# Patient Record
Sex: Female | Born: 1945
Health system: Southern US, Community
[De-identification: ages and names within clinical notes are randomized; demographics above are authoritative.]

## PROBLEM LIST (undated history)

## (undated) DIAGNOSIS — E7800 Pure hypercholesterolemia, unspecified: Secondary | ICD-10-CM

## (undated) DIAGNOSIS — K219 Gastro-esophageal reflux disease without esophagitis: Secondary | ICD-10-CM

## (undated) HISTORY — DX: Pure hypercholesterolemia, unspecified: E78.00

## (undated) HISTORY — PX: TUBAL LIGATION: SHX77

---

## 1998-09-17 ENCOUNTER — Other Ambulatory Visit: Admission: RE | Admit: 1998-09-17 | Discharge: 1998-09-17 | Payer: Self-pay | Admitting: Obstetrics and Gynecology

## 1999-09-30 ENCOUNTER — Other Ambulatory Visit: Admission: RE | Admit: 1999-09-30 | Discharge: 1999-09-30 | Payer: Self-pay | Admitting: Obstetrics and Gynecology

## 2000-10-23 ENCOUNTER — Other Ambulatory Visit: Admission: RE | Admit: 2000-10-23 | Discharge: 2000-10-23 | Payer: Self-pay | Admitting: Obstetrics and Gynecology

## 2001-11-03 ENCOUNTER — Other Ambulatory Visit: Admission: RE | Admit: 2001-11-03 | Discharge: 2001-11-03 | Payer: Self-pay | Admitting: Obstetrics and Gynecology

## 2003-08-14 ENCOUNTER — Encounter: Admission: RE | Admit: 2003-08-14 | Discharge: 2003-08-14 | Payer: Self-pay | Admitting: Surgery

## 2005-10-28 ENCOUNTER — Encounter: Admission: RE | Admit: 2005-10-28 | Discharge: 2005-10-28 | Payer: Self-pay | Admitting: Surgery

## 2005-11-10 ENCOUNTER — Encounter: Admission: RE | Admit: 2005-11-10 | Discharge: 2005-11-10 | Payer: Self-pay | Admitting: Surgery

## 2006-06-30 ENCOUNTER — Emergency Department (HOSPITAL_COMMUNITY): Admission: EM | Admit: 2006-06-30 | Discharge: 2006-06-30 | Payer: Self-pay | Admitting: Emergency Medicine

## 2006-11-18 ENCOUNTER — Encounter: Admission: RE | Admit: 2006-11-18 | Discharge: 2006-11-18 | Payer: Self-pay | Admitting: Surgery

## 2008-01-12 ENCOUNTER — Encounter: Admission: RE | Admit: 2008-01-12 | Discharge: 2008-01-12 | Payer: Self-pay | Admitting: Obstetrics and Gynecology

## 2009-01-16 ENCOUNTER — Encounter: Admission: RE | Admit: 2009-01-16 | Discharge: 2009-01-16 | Payer: Self-pay | Admitting: Obstetrics and Gynecology

## 2011-01-02 ENCOUNTER — Other Ambulatory Visit: Payer: Self-pay | Admitting: Obstetrics and Gynecology

## 2011-01-02 DIAGNOSIS — Z1231 Encounter for screening mammogram for malignant neoplasm of breast: Secondary | ICD-10-CM

## 2011-01-16 ENCOUNTER — Other Ambulatory Visit: Payer: Self-pay | Admitting: Obstetrics and Gynecology

## 2011-01-16 DIAGNOSIS — Z780 Asymptomatic menopausal state: Secondary | ICD-10-CM

## 2011-01-23 ENCOUNTER — Other Ambulatory Visit: Payer: Self-pay

## 2011-01-29 ENCOUNTER — Other Ambulatory Visit: Payer: Self-pay

## 2011-01-29 ENCOUNTER — Ambulatory Visit: Payer: Self-pay

## 2011-02-06 ENCOUNTER — Ambulatory Visit: Payer: Self-pay

## 2011-02-06 ENCOUNTER — Ambulatory Visit
Admission: RE | Admit: 2011-02-06 | Discharge: 2011-02-06 | Disposition: A | Discharge status: Home / Self Care | Payer: Medicare Other | Source: Ambulatory Visit | Attending: Obstetrics and Gynecology | Admitting: Obstetrics and Gynecology

## 2011-02-06 DIAGNOSIS — Z780 Asymptomatic menopausal state: Secondary | ICD-10-CM

## 2011-02-06 DIAGNOSIS — Z1231 Encounter for screening mammogram for malignant neoplasm of breast: Secondary | ICD-10-CM

## 2012-01-23 ENCOUNTER — Other Ambulatory Visit: Payer: Self-pay | Admitting: Obstetrics and Gynecology

## 2012-01-23 DIAGNOSIS — Z1231 Encounter for screening mammogram for malignant neoplasm of breast: Secondary | ICD-10-CM

## 2012-02-10 ENCOUNTER — Ambulatory Visit: Payer: Medicare Other

## 2012-02-11 ENCOUNTER — Ambulatory Visit: Payer: Medicare Other

## 2012-02-23 ENCOUNTER — Ambulatory Visit
Admission: RE | Admit: 2012-02-23 | Discharge: 2012-02-23 | Disposition: A | Discharge status: Home / Self Care | Payer: Medicare Other | Source: Ambulatory Visit | Attending: Obstetrics and Gynecology | Admitting: Obstetrics and Gynecology

## 2012-02-23 DIAGNOSIS — Z1231 Encounter for screening mammogram for malignant neoplasm of breast: Secondary | ICD-10-CM

## 2013-02-21 ENCOUNTER — Other Ambulatory Visit: Payer: Self-pay | Admitting: Obstetrics and Gynecology

## 2013-02-21 DIAGNOSIS — M8580 Other specified disorders of bone density and structure, unspecified site: Secondary | ICD-10-CM

## 2013-04-19 ENCOUNTER — Other Ambulatory Visit: Payer: Self-pay

## 2013-04-19 DIAGNOSIS — Z1231 Encounter for screening mammogram for malignant neoplasm of breast: Secondary | ICD-10-CM

## 2013-05-16 ENCOUNTER — Ambulatory Visit
Admission: RE | Admit: 2013-05-16 | Discharge: 2013-05-16 | Disposition: A | Discharge status: Home / Self Care | Payer: Medicare Other | Source: Ambulatory Visit | Attending: Obstetrics and Gynecology | Admitting: Obstetrics and Gynecology

## 2013-05-16 ENCOUNTER — Ambulatory Visit
Admission: RE | Admit: 2013-05-16 | Discharge: 2013-05-16 | Disposition: A | Discharge status: Home / Self Care | Payer: Medicare Other | Source: Ambulatory Visit

## 2013-05-16 DIAGNOSIS — M8580 Other specified disorders of bone density and structure, unspecified site: Secondary | ICD-10-CM

## 2013-05-16 DIAGNOSIS — Z1231 Encounter for screening mammogram for malignant neoplasm of breast: Secondary | ICD-10-CM

## 2015-12-28 ENCOUNTER — Encounter (HOSPITAL_COMMUNITY): Payer: Self-pay | Admitting: *Deleted

## 2015-12-28 ENCOUNTER — Emergency Department (HOSPITAL_COMMUNITY): Payer: Medicare Other

## 2015-12-28 ENCOUNTER — Inpatient Hospital Stay (HOSPITAL_COMMUNITY)
Admission: EM | Admit: 2015-12-28 | Discharge: 2015-12-30 | DRG: 378 | Disposition: A | Discharge status: Home / Self Care | Payer: Medicare Other | Attending: Internal Medicine | Admitting: Internal Medicine

## 2015-12-28 ENCOUNTER — Encounter (HOSPITAL_COMMUNITY)
Admission: EM | Disposition: A | Discharge status: Home / Self Care | Payer: Self-pay | Source: Home / Self Care | Attending: Internal Medicine

## 2015-12-28 DIAGNOSIS — Z23 Encounter for immunization: Secondary | ICD-10-CM | POA: Diagnosis not present

## 2015-12-28 DIAGNOSIS — K219 Gastro-esophageal reflux disease without esophagitis: Secondary | ICD-10-CM | POA: Diagnosis present

## 2015-12-28 DIAGNOSIS — E876 Hypokalemia: Secondary | ICD-10-CM | POA: Diagnosis not present

## 2015-12-28 DIAGNOSIS — K254 Chronic or unspecified gastric ulcer with hemorrhage: Principal | ICD-10-CM | POA: Diagnosis present

## 2015-12-28 DIAGNOSIS — K449 Diaphragmatic hernia without obstruction or gangrene: Secondary | ICD-10-CM

## 2015-12-28 DIAGNOSIS — Z7982 Long term (current) use of aspirin: Secondary | ICD-10-CM | POA: Diagnosis not present

## 2015-12-28 DIAGNOSIS — D62 Acute posthemorrhagic anemia: Secondary | ICD-10-CM | POA: Diagnosis present

## 2015-12-28 DIAGNOSIS — K2980 Duodenitis without bleeding: Secondary | ICD-10-CM

## 2015-12-28 DIAGNOSIS — K2970 Gastritis, unspecified, without bleeding: Secondary | ICD-10-CM

## 2015-12-28 DIAGNOSIS — K259 Gastric ulcer, unspecified as acute or chronic, without hemorrhage or perforation: Secondary | ICD-10-CM

## 2015-12-28 DIAGNOSIS — E871 Hypo-osmolality and hyponatremia: Secondary | ICD-10-CM | POA: Diagnosis present

## 2015-12-28 DIAGNOSIS — Z823 Family history of stroke: Secondary | ICD-10-CM | POA: Diagnosis not present

## 2015-12-28 DIAGNOSIS — K250 Acute gastric ulcer with hemorrhage: Secondary | ICD-10-CM | POA: Diagnosis not present

## 2015-12-28 DIAGNOSIS — R112 Nausea with vomiting, unspecified: Secondary | ICD-10-CM | POA: Diagnosis present

## 2015-12-28 DIAGNOSIS — K5900 Constipation, unspecified: Secondary | ICD-10-CM | POA: Diagnosis not present

## 2015-12-28 DIAGNOSIS — R110 Nausea: Secondary | ICD-10-CM | POA: Diagnosis not present

## 2015-12-28 DIAGNOSIS — Z8249 Family history of ischemic heart disease and other diseases of the circulatory system: Secondary | ICD-10-CM | POA: Diagnosis not present

## 2015-12-28 DIAGNOSIS — K920 Hematemesis: Secondary | ICD-10-CM | POA: Diagnosis present

## 2015-12-28 DIAGNOSIS — D649 Anemia, unspecified: Secondary | ICD-10-CM | POA: Diagnosis present

## 2015-12-28 DIAGNOSIS — K922 Gastrointestinal hemorrhage, unspecified: Secondary | ICD-10-CM | POA: Diagnosis present

## 2015-12-28 HISTORY — DX: Gastro-esophageal reflux disease without esophagitis: K21.9

## 2015-12-28 HISTORY — PX: ESOPHAGOGASTRODUODENOSCOPY: SHX5428

## 2015-12-28 LAB — DIFFERENTIAL
Basophils Absolute: 0 10*3/uL (ref 0.0–0.1)
Basophils Relative: 0 %
Eosinophils Absolute: 0 10*3/uL (ref 0.0–0.7)
Eosinophils Relative: 0 %
Lymphocytes Relative: 17 %
Lymphs Abs: 2.7 10*3/uL (ref 0.7–4.0)
Monocytes Absolute: 1.2 10*3/uL — ABNORMAL HIGH (ref 0.1–1.0)
Monocytes Relative: 7 %
Neutro Abs: 11.8 10*3/uL — ABNORMAL HIGH (ref 1.7–7.7)
Neutrophils Relative %: 76 %

## 2015-12-28 LAB — IRON AND TIBC
IRON: 154 ug/dL (ref 28–170)
SATURATION RATIOS: 40 % — AB (ref 10.4–31.8)
TIBC: 384 ug/dL (ref 250–450)
UIBC: 230 ug/dL

## 2015-12-28 LAB — GLUCOSE, CAPILLARY
GLUCOSE-CAPILLARY: 121 mg/dL — AB (ref 65–99)
Glucose-Capillary: 136 mg/dL — ABNORMAL HIGH (ref 65–99)
Glucose-Capillary: 103 mg/dL — ABNORMAL HIGH (ref 65–99)
Glucose-Capillary: 107 mg/dL — ABNORMAL HIGH (ref 65–99)

## 2015-12-28 LAB — CBC
HCT: 21 % — ABNORMAL LOW (ref 36.0–46.0)
HCT: 26.7 % — ABNORMAL LOW (ref 36.0–46.0)
HEMATOCRIT: 26.5 % — AB (ref 36.0–46.0)
Hemoglobin: 7.1 g/dL — ABNORMAL LOW (ref 12.0–15.0)
Hemoglobin: 9 g/dL — ABNORMAL LOW (ref 12.0–15.0)
Hemoglobin: 9.1 g/dL — ABNORMAL LOW (ref 12.0–15.0)
MCH: 29.5 pg (ref 26.0–34.0)
MCH: 29.6 pg (ref 26.0–34.0)
MCH: 30.2 pg (ref 26.0–34.0)
MCHC: 33.8 g/dL (ref 30.0–36.0)
MCHC: 34 g/dL (ref 30.0–36.0)
MCHC: 34.1 g/dL (ref 30.0–36.0)
MCV: 87 fL (ref 78.0–100.0)
MCV: 87.1 fL (ref 78.0–100.0)
MCV: 88.9 fL (ref 78.0–100.0)
PLATELETS: 236 10*3/uL (ref 150–400)
Platelets: 183 10*3/uL (ref 150–400)
Platelets: 326 10*3/uL (ref 150–400)
RBC: 2.41 MIL/uL — ABNORMAL LOW (ref 3.87–5.11)
RBC: 2.98 MIL/uL — ABNORMAL LOW (ref 3.87–5.11)
RBC: 3.07 MIL/uL — ABNORMAL LOW (ref 3.87–5.11)
RDW: 13.1 % (ref 11.5–15.5)
RDW: 13.2 % (ref 11.5–15.5)
RDW: 13.4 % (ref 11.5–15.5)
WBC: 14.2 10*3/uL — ABNORMAL HIGH (ref 4.0–10.5)
WBC: 14.8 10*3/uL — AB (ref 4.0–10.5)
WBC: 15.8 10*3/uL — ABNORMAL HIGH (ref 4.0–10.5)

## 2015-12-28 LAB — COMPREHENSIVE METABOLIC PANEL
ALT: 16 U/L (ref 14–54)
ALT: 14 U/L (ref 14–54)
AST: 19 U/L (ref 15–41)
AST: 14 U/L — AB (ref 15–41)
Albumin: 4 g/dL (ref 3.5–5.0)
Albumin: 3.2 g/dL — ABNORMAL LOW (ref 3.5–5.0)
Alkaline Phosphatase: 47 U/L (ref 38–126)
Alkaline Phosphatase: 37 U/L — ABNORMAL LOW (ref 38–126)
Anion gap: 13 (ref 5–15)
Anion gap: 4 — ABNORMAL LOW (ref 5–15)
BILIRUBIN TOTAL: 1 mg/dL (ref 0.3–1.2)
BUN: 56 mg/dL — ABNORMAL HIGH (ref 6–20)
BUN: 43 mg/dL — AB (ref 6–20)
CALCIUM: 7.6 mg/dL — AB (ref 8.9–10.3)
CO2: 24 mmol/L (ref 22–32)
CO2: 24 mmol/L (ref 22–32)
CREATININE: 0.58 mg/dL (ref 0.44–1.00)
Calcium: 8.5 mg/dL — ABNORMAL LOW (ref 8.9–10.3)
Chloride: 95 mmol/L — ABNORMAL LOW (ref 101–111)
Chloride: 107 mmol/L (ref 101–111)
Creatinine, Ser: 0.94 mg/dL (ref 0.44–1.00)
GFR calc Af Amer: >60 mL/min (ref >60)
GFR calc Af Amer: >60 mL/min (ref >60)
GFR calc non Af Amer: 59 mL/min — ABNORMAL LOW (ref >60)
Glucose, Bld: 232 mg/dL — ABNORMAL HIGH (ref 65–99)
Glucose, Bld: 120 mg/dL — ABNORMAL HIGH (ref 65–99)
Potassium: 3.2 mmol/L — ABNORMAL LOW (ref 3.5–5.1)
Potassium: 3.6 mmol/L (ref 3.5–5.1)
Sodium: 132 mmol/L — ABNORMAL LOW (ref 135–145)
Sodium: 135 mmol/L (ref 135–145)
TOTAL PROTEIN: 5.4 g/dL — AB (ref 6.5–8.1)
Total Bilirubin: 1.1 mg/dL (ref 0.3–1.2)
Total Protein: 6.8 g/dL (ref 6.5–8.1)

## 2015-12-28 LAB — MRSA PCR SCREENING: MRSA by PCR: NEGATIVE

## 2015-12-28 LAB — LIPASE, BLOOD: Lipase: 36 U/L (ref 11–51)

## 2015-12-28 LAB — TROPONIN I: Troponin I: <0.03 ng/mL (ref <0.031)

## 2015-12-28 LAB — SEDIMENTATION RATE: SED RATE: 8 mm/h (ref 0–22)

## 2015-12-28 LAB — PROTIME-INR
INR: 1.16 (ref 0.00–1.49)
PROTHROMBIN TIME: 15 s (ref 11.6–15.2)

## 2015-12-28 LAB — FERRITIN: FERRITIN: 52 ng/mL (ref 11–307)

## 2015-12-28 LAB — VITAMIN B12: VITAMIN B 12: 318 pg/mL (ref 180–914)

## 2015-12-28 LAB — APTT: APTT: 25 s (ref 24–37)

## 2015-12-28 LAB — ABO/RH: ABO/RH(D): O POS

## 2015-12-28 LAB — POC OCCULT BLOOD, ED: Fecal Occult Bld: POSITIVE — AB

## 2015-12-28 LAB — PREPARE RBC (CROSSMATCH)

## 2015-12-28 SURGERY — EGD (ESOPHAGOGASTRODUODENOSCOPY)
Anesthesia: Moderate Sedation

## 2015-12-28 MED ORDER — ONDANSETRON HCL 4 MG/2ML IJ SOLN
4.0000 mg | Freq: Once | INTRAMUSCULAR | Status: AC | PRN
  Administered 2015-12-28: 4 mg via INTRAVENOUS
  Filled 2015-12-28: qty 2

## 2015-12-28 MED ORDER — SODIUM CHLORIDE 0.9 % IV SOLN
80.0000 mg | Freq: Once | INTRAVENOUS | Status: AC
  Administered 2015-12-28: 80 mg via INTRAVENOUS
  Filled 2015-12-28: qty 80

## 2015-12-28 MED ORDER — MIDAZOLAM HCL 5 MG/5ML IJ SOLN
INTRAMUSCULAR | Status: AC
  Administered 2015-12-28: 21:00:00
  Filled 2015-12-28: qty 10

## 2015-12-28 MED ORDER — PNEUMOCOCCAL VAC POLYVALENT 25 MCG/0.5ML IJ INJ
0.5000 mL | INJECTION | INTRAMUSCULAR | Status: AC
  Administered 2015-12-29: 0.5 mL via INTRAMUSCULAR
  Filled 2015-12-28: qty 0.5

## 2015-12-28 MED ORDER — ACETAMINOPHEN 325 MG PO TABS
650.0000 mg | ORAL_TABLET | Freq: Four times a day (QID) | ORAL | Status: DC | PRN
  Administered 2015-12-28: 650 mg via ORAL
  Filled 2015-12-28: qty 2

## 2015-12-28 MED ORDER — SODIUM CHLORIDE 0.9 % IV BOLUS (SEPSIS)
500.0000 mL | Freq: Once | INTRAVENOUS | Status: AC
  Administered 2015-12-28: 500 mL via INTRAVENOUS

## 2015-12-28 MED ORDER — LIDOCAINE VISCOUS 2 % MT SOLN
OROMUCOSAL | Status: AC
  Administered 2015-12-28: 21:00:00
  Filled 2015-12-28: qty 15

## 2015-12-28 MED ORDER — SODIUM CHLORIDE 0.9 % IV SOLN
Freq: Once | INTRAVENOUS | Status: AC
Start: 2015-12-28 — End: 2015-12-28
  Administered 2015-12-28: 09:00:00 via INTRAVENOUS

## 2015-12-28 MED ORDER — STERILE WATER FOR IRRIGATION IR SOLN
Status: DC | PRN
  Administered 2015-12-28: 21:00:00

## 2015-12-28 MED ORDER — SODIUM CHLORIDE 0.9% FLUSH
3.0000 mL | Freq: Two times a day (BID) | INTRAVENOUS | Status: DC
  Administered 2015-12-28 – 2015-12-29 (×5): 3 mL via INTRAVENOUS

## 2015-12-28 MED ORDER — ACETAMINOPHEN 650 MG RE SUPP: 650.0000 mg | Freq: Four times a day (QID) | RECTAL | Status: DC | PRN

## 2015-12-28 MED ORDER — SODIUM CHLORIDE 0.9 % IV SOLN
INTRAVENOUS | Status: DC
  Administered 2015-12-28: 1000 mL via INTRAVENOUS
  Administered 2015-12-28: 04:00:00 via INTRAVENOUS

## 2015-12-28 MED ORDER — PANTOPRAZOLE SODIUM 40 MG IV SOLR
INTRAVENOUS | Status: AC
  Filled 2015-12-28: qty 160

## 2015-12-28 MED ORDER — SODIUM CHLORIDE 0.9 % IV BOLUS (SEPSIS)
1000.0000 mL | Freq: Once | INTRAVENOUS | Status: AC
  Administered 2015-12-28: 1000 mL via INTRAVENOUS

## 2015-12-28 MED ORDER — MIDAZOLAM HCL 5 MG/5ML IJ SOLN
INTRAMUSCULAR | Status: DC | PRN
  Administered 2015-12-28: 2 mg via INTRAVENOUS

## 2015-12-28 MED ORDER — SODIUM CHLORIDE 0.9 % IV SOLN
INTRAVENOUS | Status: DC
  Administered 2015-12-28: 04:00:00 via INTRAVENOUS

## 2015-12-28 MED ORDER — MEPERIDINE HCL 100 MG/ML IJ SOLN
INTRAMUSCULAR | Status: DC | PRN
  Administered 2015-12-28: 25 mg via INTRAVENOUS

## 2015-12-28 MED ORDER — PANTOPRAZOLE SODIUM 40 MG IV SOLR: 40.0000 mg | Freq: Two times a day (BID) | INTRAVENOUS | Status: DC

## 2015-12-28 MED ORDER — INSULIN ASPART 100 UNIT/ML ~~LOC~~ SOLN: 0.0000 [IU] | Freq: Three times a day (TID) | SUBCUTANEOUS | Status: DC

## 2015-12-28 MED ORDER — MEPERIDINE HCL 100 MG/ML IJ SOLN
INTRAMUSCULAR | Status: AC
  Administered 2015-12-28: 21:00:00
  Filled 2015-12-28: qty 2

## 2015-12-28 MED ORDER — SODIUM CHLORIDE 0.9 % IV SOLN
8.0000 mg/h | INTRAVENOUS | Status: DC
  Administered 2015-12-28 (×2): 8 mg/h via INTRAVENOUS
  Filled 2015-12-28 (×9): qty 80

## 2015-12-28 MED ORDER — LIDOCAINE VISCOUS 2 % MT SOLN
OROMUCOSAL | Status: DC | PRN
  Administered 2015-12-28: 6 mL via OROMUCOSAL

## 2015-12-28 MED ORDER — SODIUM CHLORIDE 0.9 % IV SOLN
INTRAVENOUS | Status: AC
  Administered 2015-12-28: 04:00:00 via INTRAVENOUS

## 2015-12-28 MED ORDER — POTASSIUM CHLORIDE 10 MEQ/100ML IV SOLN
10.0000 meq | INTRAVENOUS | Status: AC
  Administered 2015-12-28 (×2): 10 meq via INTRAVENOUS
  Filled 2015-12-28 (×2): qty 100

## 2015-12-28 NOTE — Progress Notes (Signed)
Follow up from admission earlier today:  [id number] female with hx of GERD admitted for UGIB with Hb of 9 g per dL, elevated BUN, with no abdominal pain.  No NSAIDS or alcohol use.  Never had EGD or colonoscopy.   Hb dropped further this am to 7 grams.  Exam is benign and hemodynamically stable. Will give 2 units of PRBC, and await GI consultation.  Will keep NPO and continue with PPI drip.    Houston SirenPeter Avalin Briley, MD  FACP. Hospitalist.

## 2015-12-28 NOTE — Care Management Important Message (Signed)
Important Message  Patient Details  Name: Kimberly Davis MRN: [id number] Date of Birth: 1946/03/18   Medicare Important Message Given:  Yes    Malcolm MetroChildress, Mekai Wilkinson Demske, RN 12/28/2015, 2:25 PM

## 2015-12-28 NOTE — H&P (Signed)
  Primary Care Physician:  Rudi HeapMOORE, DONALD, MD Primary Gastroenterologist:  Dr. Darrick PennaFields  Pre-Procedure History & Physical: HPI:  Kimberly Davis is a 70 y.o. female here for HEMATEMESIS.  Past Medical History  Diagnosis Date  . GERD (gastroesophageal reflux disease)     Past Surgical History  Procedure Laterality Date  . Tubal ligation      Prior to Admission medications   Medication Sig Start Date End Date Taking? Authorizing Provider  Cholecalciferol (VITAMIN D PO) Take 1 tablet by mouth daily.   Yes Historical Provider, MD  Multiple Vitamin (MULTIVITAMIN WITH MINERALS) TABS tablet Take 1 tablet by mouth daily.   Yes Historical Provider, MD  VITAMIN E PO Take 1 tablet by mouth daily.   Yes Historical Provider, MD    Allergies as of 12/28/2015  . (No Known Allergies)    Family History  Problem Relation Age of Onset  . Stroke Mother   . Heart disease Father   . Colon cancer Neg Hx     Social History   Social History  . Marital Status: Married    Spouse Name: N/A  . Number of Children: 3  . Years of Education: N/A   Occupational History  . [employer]    Social History Main Topics  . Smoking status: Never Smoker   . Smokeless tobacco: Not on file  . Alcohol Use: No  . Drug Use: No  . Sexual Activity: Not on file   Other Topics Concern  . Not on file   Social History Narrative    Review of Systems: See HPI, otherwise negative ROS   Physical Exam: BP 107/56 mmHg  Pulse 101  Temp(Src) 98.9 F (37.2 C) (Oral)  Resp 25  Ht 5\' 5"  (1.651 m)  Wt 180 lb 5.4 oz (81.8 kg)  BMI 30.01 kg/m2  SpO2 96% General:   Alert,  pleasant and cooperative in NAD Head:  Normocephalic and atraumatic. Neck:  Supple; Lungs:  Clear throughout to auscultation.    Heart:  Regular rate and rhythm. Abdomen:  Soft, nontender and nondistended. Normal bowel sounds, without guarding, and without rebound.   Neurologic:  Alert and  oriented x4;  grossly normal  neurologically.  Impression/Plan:     HEMATEMESIS  PLAN:  EGD TODAY

## 2015-12-28 NOTE — H&P (Addendum)
TRH H&P   Patient Demographics:    Kimberly Davis, is a 70 y.o. female  MRN: [id number]   DOB - Apr 16, 1946  Admit Date - 12/28/2015  Outpatient Primary MD for the patient is Redge Gainer, MD  Referring MD/NP/PA: Wyona Almas  Outpatient Specialists:   Patient coming from: home  Chief Complaint  Patient presents with  . Nausea      HPI:    Kimberly Davis  is a 70 y.o. female, apparently was at home c/o n/v, hematemesis.  Pt notes that she has had some black stool.  Yesterday.  Denies nsaid use. Pt denies fever, chills, abd pain, diarrhea. Brbpr.   Pt has not had any prior colonscopy or EGD.  No hx of PUD.  Pt was brought to to ED by her daughter  "[place]" and she was noted to be anemic with hgb of 9.1.  Heme positive.  Pt was noted to be hypokalemic.  GI has been paged per ED. Pt will be admitted for w/up of hematemsis.     Review of systems:    In addition to the HPI above,No Fever-chills, No Headache, No changes with Vision or hearing, No problems swallowing food or Liquids, No Chest pain, Cough or Shortness of Breath, No Abdominal pain, Bowel movements are regular, No Blood in stool or Urine, No dysuria, No new skin rashes or bruises, No new joints pains-aches,  No new weakness, tingling, numbness in any extremity, No recent weight gain or loss, No polyuria, polydypsia or polyphagia, No significant Mental Stressors.  A full 10 point Review of Systems was done, except as stated above, all other Review of Systems were negative.   With Past History of the following :    Past Medical History  Diagnosis Date  . GERD (gastroesophageal reflux disease)       Past Surgical History  Procedure Laterality Date  . Tubal ligation        Social History:     Social History  Substance Use Topics  . Smoking status: Never Smoker   . Smokeless tobacco: Not on file  .  Alcohol Use: No     Lives - w husband at home.  Mobility - walks without assistance    Family History :     Family History  Problem Relation Age of Onset  . Stroke Mother   . Heart disease Father       Home Medications:   Prior to Admission medications   Not on File     Allergies:    No Known Allergies   Physical Exam:   Vitals  Blood pressure 119/70, pulse 102, temperature 97.4 F (36.3 C), temperature source Oral, resp. rate 16, height '5\' 5"'  (1.651 m), weight 81.194 kg (179 lb), SpO2 100 %.   1. General elderly white female.  lying in bed in NAD,   2. Normal affect  and insight, Not Suicidal or Homicidal, Awake Alert, Oriented X 3.  3. No F.N deficits, ALL C.Nerves Intact, Strength 5/5 all 4 extremities, Sensation intact all 4 extremities, Plantars down going.  4. Ears and Eyes appear Normal, Conjunctivae clear, PERRLA. Moist Oral Mucosa. Slightly pale conjunctiva  5. Supple Neck, No JVD, No cervical lymphadenopathy appriciated, No Carotid Bruits.  6. Symmetrical Chest wall movement, Good air movement bilaterally, CTAB.  7. RRR, No Gallops, Rubs or Murmurs, No Parasternal Heave.  8. Positive Bowel Sounds, Abdomen Soft, No tenderness, No organomegaly appriciated,No rebound -guarding or rigidity.  9.  No Cyanosis, Normal Skin Turgor, No Skin Rash or Bruise.  10. Good muscle tone,  joints appear normal , no effusions, Normal ROM.  11. No Palpable Lymph Nodes in Neck or Axillae    Data Review:    CBC  Recent Labs Lab 12/28/15 0030  WBC 15.8*  HGB 9.1*  HCT 26.7*  PLT 326  MCV 87.0  MCH 29.6  MCHC 34.1  RDW 13.1  LYMPHSABS 2.7  MONOABS 1.2*  EOSABS 0.0  BASOSABS 0.0   ------------------------------------------------------------------------------------------------------------------  Chemistries   Recent Labs Lab 12/28/15 0030  NA 132*  K 3.2*  CL 95*  CO2 24  GLUCOSE 232*  BUN 56*  CREATININE 0.94  CALCIUM 8.5*  AST 19  ALT 16    ALKPHOS 47  BILITOT 1.1   ------------------------------------------------------------------------------------------------------------------ estimated creatinine clearance is 57 mL/min (by C-G formula based on Cr of 0.94). ------------------------------------------------------------------------------------------------------------------ No results for input(s): TSH, T4TOTAL, T3FREE, THYROIDAB in the last 72 hours.  Invalid input(s): FREET3  Coagulation profile  Recent Labs Lab 12/28/15 0030  INR 1.16   ------------------------------------------------------------------------------------------------------------------- No results for input(s): DDIMER in the last 72 hours. -------------------------------------------------------------------------------------------------------------------  Cardiac Enzymes  Recent Labs Lab 12/28/15 0030  TROPONINI <0.03   ------------------------------------------------------------------------------------------------------------------ No results found for: BNP   ---------------------------------------------------------------------------------------------------------------  Urinalysis No results found for: COLORURINE, APPEARANCEUR, LABSPEC, PHURINE, GLUCOSEU, HGBUR, BILIRUBINUR, KETONESUR, PROTEINUR, UROBILINOGEN, NITRITE, LEUKOCYTESUR  ----------------------------------------------------------------------------------------------------------------   Imaging Results:    Dg Abd Acute W/chest  12/28/2015  CLINICAL DATA:  Nausea vomiting and constipation since yesterday. EXAM: DG ABDOMEN ACUTE W/ 1V CHEST COMPARISON:  None. FINDINGS: The abdominal gas pattern is negative for obstruction or perforation. Stool and air is present throughout the colon. No biliary or urinary calculi are evident. There is a hiatal hernia. No acute cardiopulmonary findings are evident. IMPRESSION: Negative for obstruction or perforation. Hiatal hernia. No acute  cardiopulmonary findings. Electronically Signed   By: Andreas Newport M.D.   On: 12/28/2015 01:59      Assessment & Plan:    Principal Problem:   Hematemesis Active Problems:   Anemia   Nausea & vomiting   Hypokalemia   Hyponatremia    1. Hematemesis Bun elevation c/w UGI bleeding NPO, protonix 11m iv x1, and then 839m/hr GI consult for EGD/colonscopy Appreciate GI in put  2. N/v zofran 45m35mv q6h prn  3. Anemia Check ferritin, iron/tibc, b12, folate, esr Consider spep, upep Serial cbc q6h x4.   4.  Hypokalemia Replete Check cmp in am  5.  Hyponatremia Check cmp in am Hydrate with ns iv.   6. Hyperglycemia Bs>200 Check hga1c,  fsbs q4h ISS (sensitive)   DVT Prophylaxis SCD AM Labs Ordered, also please review Full Orders  Family Communication: Admission, patients condition and plan of care including tests being ordered have been discussed with the patient  who indicate understanding and agree with the plan and Code Status.  Code Status FULL CODE  Likely DC to  home  Condition GUARDED    Consults called: GI per ED   Admission status: inpatient  Time spent in minutes : 40 minutes  Critical care time   Jani Gravel M.D on 12/28/2015 at 2:25 AM   [phone] Between 7am to 7pm - Pager - [phone]. After 7pm go to [email or web address] - password [facility]  [facility] - Office  [phone]

## 2015-12-28 NOTE — ED Provider Notes (Signed)
TIME SEEN:  By signing my name below, I, Kimberly Davis, attest that this documentation has been prepared under the direction and in the presence of Enbridge EnergyKristen N Emme Rosenau, DO. Electronically Signed: Octavia HeirArianna Davis, ED Scribe. 12/28/2015. 12:26 AM.   CHIEF COMPLAINT:  Nausea  HPI:  HPI Comments: Kimberly Davis is a 70 y.o. female who has no pertinent medical history presents to the Emergency Department complaining of sudden onset, intermittent, gradual worsening nausea with associated hematemesis, melena, and constipation onset yesterday evening around 6pm. She says her last normal bowel movement was one week ago. Pt notes taking a stool softener without any relief. Per family member, pt lost consciousness yesterday three times going from sitting to standing. He says she did not hit her head. She denies abdominal pain, chest pain, shortness of breath, fever, chills, hematochezia, heavy NSAID use, etoh use, and endoscopy. Pt is not on any blood thinners. No prior history of GI bleed. Reports her last normal bowel movement was a week ago. She has noticed black stools for the past 1-2 days. Not having gastroenterologist. Her PCP is Dr.  Christell ConstantMoore.  She is a FULL CODE.  ROS: See HPI Constitutional: no fever  Eyes: no drainage  ENT: no runny nose   Cardiovascular:  no chest pain  Resp: no SOB  GI:  vomiting GU: no dysuria Integumentary: no rash  Allergy: no hives  Musculoskeletal: no leg swelling  Neurological: no slurred speech ROS otherwise negative  PAST MEDICAL HISTORY/PAST SURGICAL HISTORY:  No past medical history on file.  MEDICATIONS:  Prior to Admission medications   Not on File    ALLERGIES:  Not on File  SOCIAL HISTORY:  Social History  Substance Use Topics  . Smoking status: Not on file  . Smokeless tobacco: Not on file  . Alcohol Use: Not on file    FAMILY HISTORY: No family history on file.  EXAM: Triage vitals: BP 111/76 mmHg  Pulse 118  Temp(Src) 97.4 F (36.3 C) (Oral)   Resp 20  Ht 5\' 5"  (1.651 m)  Wt 179 lb (81.194 kg)  BMI 29.79 kg/m2  SpO2 97% CONSTITUTIONAL: Alert and oriented and responds appropriately to questions. Appears pale, elderly, uncomfortable HEAD: Normocephalic EYES: Conjunctivae clear, PERRL, pale conjunctiva ENT: normal nose; no rhinorrhea; dry mucous membranes NECK: Supple, no meningismus, no LAD  CARD: Regular and tachycardic; S1 and S2 appreciated; no murmurs, no clicks, no rubs, no gallops RESP: Normal chest excursion without splinting, patient is slightly tachypneic; breath sounds clear and equal bilaterally; no wheezes, no rhonchi, no rales, no hypoxia or respiratory distress, speaking full sentences ABD/GI: Normal bowel sounds; non-distended; soft, non-tender, no rebound, no guarding, no peritoneal signs RECTAL:  Normal rectal tone, patient has no gross blood but does have melena, she is strongly guaiac positive, small external hemorrhoids present without bleeding, no fecal impaction, soft stool in the rectal vault, nontender rectal exam BACK:  The back appears normal and is non-tender to palpation, there is no CVA tenderness EXT: Normal ROM in all joints; non-tender to palpation; no edema; normal capillary refill; no cyanosis, no calf tenderness or swelling    SKIN: Normal color for age and race; warm; no rash NEURO: Moves all extremities equally, sensation to light touch intact diffusely, cranial nerves II through XII intact PSYCH: The patient's mood and manner are appropriate. Grooming and personal hygiene are appropriate.  MEDICAL DECISION MAKING: Patient here with gastrointestinal bleed. She is pale, tachycardic, tachypneic. Hemoglobin is 9.1 with no old for  comparison. She is not sure what her baseline hemoglobin is. We'll give IV fluids, place 2 large-bore peripheral IVs. We'll give Protonix for upper GI bleed. She will need admission. Discussed with patient and family that she may need packed red blood cells and she agrees.  This time she is a full code. Abdominal exam is benign.  ED PROGRESS: 2:10 AM  Patient's BUN is elevated consistent with upper GI bleed. She also has a leukocytosis with left shift which may be reactive. No fever. No further vomiting in the emergency department and no passage of melena. Coags are normal. Heart rate and rest her rate have improved with IV hydration. She looks much better and has better color in her face. I feel she is stable. Do not feel this time she needs to be taken emergently to the endoscopy suite. She is receiving IV Protonix. X-ray shows no obstruction or perforation. Will discuss with hospitalist for admission to step down.  2:20 AM  Discussed patient's case with hospitalist, Dr. Selena Batten.  Recommend admission to step down, inpatient bed.  I will place holding orders per their request. Patient and family (if present) updated with plan. Care transferred to hospitalist service.  I reviewed all nursing notes, vitals, pertinent old records, EKGs, labs, imaging (as available).    EKG Interpretation  Date/Time:  Friday Dec 28 2015 01:04:39 EDT Ventricular Rate:  113 PR Interval:  149 QRS Duration: 92 QT Interval:  325 QTC Calculation: 446 R Axis:   25 Text Interpretation:  Sinus tachycardia Ventricular premature complex Low voltage, precordial leads Abnormal R-wave progression, early transition Nonspecific T abnormalities, diffuse leads No old tracing to compare Confirmed by Staton Markey,  DO, Earland Reish ([id number]) on 12/28/2015 1:08:32 AM        CRITICAL CARE Performed by: Raelyn Number   Total critical care time: 45 minutes  Critical care time was exclusive of separately billable procedures and treating other patients.  Critical care was necessary to treat or prevent imminent or life-threatening deterioration.  Critical care was time spent personally by me on the following activities: development of treatment plan with patient and/or surrogate as well as nursing, discussions with  consultants, evaluation of patient's response to treatment, examination of patient, obtaining history from patient or surrogate, ordering and performing treatments and interventions, ordering and review of laboratory studies, ordering and review of radiographic studies, pulse oximetry and re-evaluation of patient's condition.    I personally performed the services described in this documentation, which was scribed in my presence. The recorded information has been reviewed and is accurate.   Layla Maw Jasim Harari, DO 12/28/15 [phone]

## 2015-12-28 NOTE — Care Management Note (Signed)
Case Management Note  Patient Details  Name: Kimberly Davis MRN: [id number] Date of Birth: 05-12-1946  Subjective/Objective:                  Pt admitted for UGIB. Pt is from home, lives with husband, has PCP, transportation and no difficulty affording medications. Pt plans to return home with self care at DC.   Action/Plan: No CM needs anticipated.   Expected Discharge Date:    12/28/2015              Expected Discharge Plan:  Home/Self Care  In-House Referral:  NA  Discharge planning Services  NA  Post Acute Care Choice:  NA Choice offered to:  NA  DME Arranged:    DME Agency:     HH Arranged:    HH Agency:     Status of Service:  Completed, signed off  Medicare Important Message Given:    yes Date Medicare IM Given:    Medicare IM give by:    Date Additional Medicare IM Given:    Additional Medicare Important Message give by:     If discussed at Long Length of Stay Meetings, dates discussed:    Additional Comments:  Kimberly Davis, Kimberly Mcleish Demske, RN 12/28/2015, 2:22 PM

## 2015-12-28 NOTE — ED Notes (Signed)
Pt started vomiting yesterday and has been constipated; pt's last BM was yesterday and it was small

## 2015-12-28 NOTE — Consult Note (Signed)
Referring Provider: Houston Siren, MD Primary Care Physician:  Rudi Heap, MD Primary Gastroenterologist:  Jonette Eva, MD  Reason for Consultation:  hematemesis  HPI: Kimberly Davis is a 70 y.o. female presented early this AM with complaints of worsening N/V, hematemesis, melena, constipation. Symptoms initially began several days ago. Thought she had GI bug. Going around at work. Frequent vomiting and felt dehydrated. This morning vomiting blood. Had noted dark stools for 1-2 days, this morning black tarry stool. Last normal bowel movement was one week ago. Apparently patient lost consciousness three times yesterday, postural. No abdominal pain, rectal bleeding. No significant heartburn. Takes TUMS prn. No dysphagia. Worked up until yesterday. Takes ASA 81mg  daily but no other NSAIDs. No prior GI bleeding. No EGD/TCS.   In ER, melanotic stool, heme + on DRE. Hgb 9.1. Patient denies history of anemia. Last physical about two years ago. This morning Hgb down to 7.1. Platelets and INR normal.  Feels better at this time. No further nausea.    Prior to Admission medications   Medication Sig Start Date End Date Taking? Authorizing Provider  Cholecalciferol (VITAMIN D PO) Take 1 tablet by mouth daily.   Yes Historical Provider, MD  Multiple Vitamin (MULTIVITAMIN WITH MINERALS) TABS tablet Take 1 tablet by mouth daily.   Yes Historical Provider, MD  VITAMIN E PO Take 1 tablet by mouth daily.   Yes Historical Provider, MD  TUMS prn ASA 81mg  daily  Current Facility-Administered Medications  Medication Dose Route Frequency Provider Last Rate Last Dose  . acetaminophen (TYLENOL) tablet 650 mg  650 mg Oral Q6H PRN Pearson Grippe, MD       Or  . acetaminophen (TYLENOL) suppository 650 mg  650 mg Rectal Q6H PRN Pearson Grippe, MD      . insulin aspart (novoLOG) injection 0-9 Units  0-9 Units Subcutaneous TID WC Pearson Grippe, MD   Stopped at 12/28/15 [id number]  . pantoprazole (PROTONIX) 80 mg in sodium chloride 0.9 % 250  mL (0.32 mg/mL) infusion  8 mg/hr Intravenous Continuous Kristen N Ward, DO 25 mL/hr at 12/28/15 1300 8 mg/hr at 12/28/15 1300  . [START ON 12/29/2015] pneumococcal 23 valent vaccine (PNU-IMMUNE) injection 0.5 mL  0.5 mL Intramuscular Tomorrow-1000 Pearson Grippe, MD      . sodium chloride flush (NS) 0.9 % injection 3 mL  3 mL Intravenous Q12H Pearson Grippe, MD   3 mL at 12/28/15 0847    Allergies as of 12/28/2015  . (No Known Allergies)    Past Medical History  Diagnosis Date  . GERD (gastroesophageal reflux disease)     Past Surgical History  Procedure Laterality Date  . Tubal ligation      Family History  Problem Relation Age of Onset  . Stroke Mother   . Heart disease Father   . Colon cancer Neg Hx     Social History   Social History  . Marital Status: Married    Spouse Name: N/A  . Number of Children: 3  . Years of Education: N/A   Occupational History  . [employer]    Social History Main Topics  . Smoking status: Never Smoker   . Smokeless tobacco: Not on file  . Alcohol Use: No  . Drug Use: No  . Sexual Activity: Not on file   Other Topics Concern  . Not on file   Social History Narrative     ROS:  General: No fever or chills. +fatigue, weakness. Eyes: Negative for vision changes.  ENT:  Negative for hoarseness, difficulty swallowing , nasal congestion. CV: Negative for chest pain, angina, palpitations, dyspnea on exertion, peripheral edema.  Respiratory: Negative for dyspnea at rest, dyspnea on exertion, cough, sputum, wheezing.  GI: See history of present illness. GU:  Negative for dysuria, hematuria, urinary incontinence, urinary frequency, nocturnal urination.  MS: Negative for joint pain, low back pain.  Derm: Negative for rash or itching.  Neuro: Negative for weakness, abnormal sensation, seizure, frequent headaches, memory loss, confusion.  Psych: Negative for anxiety, depression, suicidal ideation, hallucinations.  Endo: Negative for unusual weight  change.  Heme: Negative for bruising or bleeding. Allergy: Negative for rash or hives.       Physical Examination: Vital signs in last 24 hours: Temp:  [97 F (36.1 C)-98.9 F (37.2 C)] 98.7 F (37.1 C) (05/12 1300) Pulse Rate:  [96-118] 102 (05/12 1300) Resp:  [16-22] 22 (05/12 1300) BP: (91-119)/(55-76) 97/55 mmHg (05/12 1300) SpO2:  [97 %-100 %] 98 % (05/12 1300) Weight:  [179 lb (81.194 kg)-180 lb 5.4 oz (81.8 kg)] 180 lb 5.4 oz (81.8 kg) (05/12 0329) Last BM Date: 12/28/15  General: Well-nourished, well-developed in no acute distress. accompanied by daughter.  Head: Normocephalic, atraumatic.   Eyes: Conjunctiva pink, no icterus. Mouth: Oropharyngeal mucosa moist and pink , no lesions erythema or exudate. Neck: Supple without thyromegaly, masses, or lymphadenopathy.  Lungs: Clear to auscultation bilaterally.  Heart: Regular rate and rhythm, no murmurs rubs or gallops.  Abdomen: Bowel sounds are normal, nontender, nondistended, no hepatosplenomegaly or masses, no abdominal bruits or    hernia , no rebound or guarding.   Rectal: not performed Extremities: No lower extremity edema, clubbing, deformity.  Neuro: Alert and oriented x 4 , grossly normal neurologically.  Skin: Warm and dry, no rash or jaundice.   Psych: Alert and cooperative, normal mood and affect.        Intake/Output from previous day: 05/11 0701 - 05/12 0700 In: 2704.2 [I.V.:2704.2] Out: -  Intake/Output this shift: Total I/O In: 770 [I.V.:450; Blood:320] Out: 450 [Urine:450]  Lab Results: CBC  Recent Labs  12/28/15 0030 12/28/15 0705  WBC 15.8* 14.2*  HGB 9.1* 7.1*  HCT 26.7* 21.0*  MCV 87.0 87.1  PLT 326 236   BMET  Recent Labs  12/28/15 0030 12/28/15 0436  NA 132* 135  K 3.2* 3.6  CL 95* 107  CO2 24 24  GLUCOSE 232* 120*  BUN 56* 43*  CREATININE 0.94 0.58  CALCIUM 8.5* 7.6*   LFT  Recent Labs  12/28/15 0030 12/28/15 0436  BILITOT 1.1 1.0  ALKPHOS 47 37*  AST 19 14*   ALT 16 14  PROT 6.8 5.4*  ALBUMIN 4.0 3.2*    Lipase  Recent Labs  12/28/15 0030  LIPASE 36    PT/INR  Recent Labs  12/28/15 0030  LABPROT 15.0  INR 1.16      Imaging Studies: Dg Abd Acute W/chest  12/28/2015  CLINICAL DATA:  Nausea vomiting and constipation since yesterday. EXAM: DG ABDOMEN ACUTE W/ 1V CHEST COMPARISON:  None. FINDINGS: The abdominal gas pattern is negative for obstruction or perforation. Stool and air is present throughout the colon. No biliary or urinary calculi are evident. There is a hiatal hernia. No acute cardiopulmonary findings are evident. IMPRESSION: Negative for obstruction or perforation. Hiatal hernia. No acute cardiopulmonary findings. Electronically Signed   By: Ellery Plunkaniel R Mitchell M.D.   On: 12/28/2015 01:59  [4 week]   Impression: 70 y/o female with recent onset N/V, constipation  over the past several days who presented to hospital with melena, weakness, syncope. Developed hematemesis as well. Hgb down 2 grams/dL from 9 to 7 since this morning. Hemodynamically stable at this time. Receiving two units of prbcs. Suspect M-W tear.   Plan: 1. Plan on EGD, possible later today or tomorrow. Discussed with patient and daughter,  I have discussed the risks, alternatives, benefits with regards to but not limited to the risk of reaction to medication, bleeding, infection, perforation and the patient is agreeable to proceed. Written consent to be obtained. 2. PPI as ordered. 3. Colonoscopy at a later date.   We would like to thank you for the opportunity to participate in the care of Kimberly Davis.  Leanna Battles. Dixon Boos [facility] [phone] 5/12/20172:08 PM     LOS: 0 days

## 2015-12-29 DIAGNOSIS — K254 Chronic or unspecified gastric ulcer with hemorrhage: Secondary | ICD-10-CM | POA: Diagnosis present

## 2015-12-29 DIAGNOSIS — K449 Diaphragmatic hernia without obstruction or gangrene: Secondary | ICD-10-CM

## 2015-12-29 LAB — CBC
HEMATOCRIT: 23.7 % — AB (ref 36.0–46.0)
HEMOGLOBIN: 7.9 g/dL — AB (ref 12.0–15.0)
MCH: 29.8 pg (ref 26.0–34.0)
MCHC: 33.3 g/dL (ref 30.0–36.0)
MCV: 89.4 fL (ref 78.0–100.0)
Platelets: 174 10*3/uL (ref 150–400)
RBC: 2.65 MIL/uL — AB (ref 3.87–5.11)
RDW: 14.4 % (ref 11.5–15.5)
WBC: 8.7 10*3/uL (ref 4.0–10.5)

## 2015-12-29 LAB — TYPE AND SCREEN
ABO/RH(D): O POS
ANTIBODY SCREEN: NEGATIVE
Unit division: 0
Unit division: 0

## 2015-12-29 LAB — CBC WITH DIFFERENTIAL/PLATELET
BASOS ABS: 0 10*3/uL (ref 0.0–0.1)
BASOS PCT: 0 %
EOS PCT: 1 %
Eosinophils Absolute: 0.1 10*3/uL (ref 0.0–0.7)
HEMATOCRIT: 24.3 % — AB (ref 36.0–46.0)
Hemoglobin: 8 g/dL — ABNORMAL LOW (ref 12.0–15.0)
LYMPHS PCT: 16 %
Lymphs Abs: 1.6 10*3/uL (ref 0.7–4.0)
MCH: 29.4 pg (ref 26.0–34.0)
MCHC: 32.9 g/dL (ref 30.0–36.0)
MCV: 89.3 fL (ref 78.0–100.0)
MONO ABS: 0.8 10*3/uL (ref 0.1–1.0)
MONOS PCT: 8 %
NEUTROS ABS: 7.3 10*3/uL (ref 1.7–7.7)
Neutrophils Relative %: 75 %
PLATELETS: 156 10*3/uL (ref 150–400)
RBC: 2.72 MIL/uL — ABNORMAL LOW (ref 3.87–5.11)
RDW: 14.2 % (ref 11.5–15.5)
WBC: 9.9 10*3/uL (ref 4.0–10.5)

## 2015-12-29 LAB — HEMOGLOBIN A1C
Hgb A1c MFr Bld: 5.7 % — ABNORMAL HIGH (ref 4.8–5.6)
MEAN PLASMA GLUCOSE: 117 mg/dL

## 2015-12-29 LAB — HEMOGLOBIN AND HEMATOCRIT, BLOOD
HCT: 23.1 % — ABNORMAL LOW (ref 36.0–46.0)
HEMATOCRIT: 24.4 % — AB (ref 36.0–46.0)
HEMOGLOBIN: 7.7 g/dL — AB (ref 12.0–15.0)
HEMOGLOBIN: 8.1 g/dL — AB (ref 12.0–15.0)

## 2015-12-29 MED ORDER — PANTOPRAZOLE SODIUM 40 MG PO TBEC
40.0000 mg | DELAYED_RELEASE_TABLET | Freq: Two times a day (BID) | ORAL | Status: DC
  Administered 2015-12-29 – 2015-12-30 (×3): 40 mg via ORAL
  Filled 2015-12-29 (×3): qty 1

## 2015-12-29 NOTE — Op Note (Signed)
[facility] Patient Name: Kimberly Davis Procedure Date: 12/28/2015 8:30 PM MRN: [id number] Date of Birth: [birth year] Attending MD: Jonette Eva , MD CSN: [id number] Age: 70 Admit Type: Inpatient Procedure:                Upper GI endoscopy WITH COLD FORCEPS BIOPSY Indications:              Hematemesis, Melena Providers:                Jonette Eva, MD, Jannett Celestine, RN, Judee Clara, RN Referring MD:             Ernestina Penna Medicines:                Meperidine 25 mg IV, Midazolam 2 mg IV Complications:            No immediate complications. Estimated Blood Loss:     Estimated blood loss was minimal. Procedure:                Pre-Anesthesia Assessment:                           - Prior to the procedure, a History and Physical                            was performed, and patient medications and                            allergies were reviewed. The patient's tolerance of                            previous anesthesia was also reviewed. The risks                            and benefits of the procedure and the sedation                            options and risks were discussed with the patient.                            All questions were answered, and informed consent                            was obtained. Prior Anticoagulants: The patient has                            taken aspirin, last dose was 1 day prior to                            procedure. ASA Grade Assessment: II - A patient                            with mild systemic disease. After reviewing the                            risks and benefits, the patient was deemed in  satisfactory condition to undergo the procedure.                           After obtaining informed consent, the endoscope was                            passed under direct vision. Throughout the                            procedure, the patient's blood pressure, pulse, and                            oxygen saturations  were monitored continuously. The                            EG-[id number] ([id number]) scope was introduced through the                            mouth, and advanced to the second part of duodenum.                            After obtaining informed consent, the endoscope was                            passed under direct vision. Throughout the                            procedure, the patient's blood pressure, pulse, and                            oxygen saturations were monitored continuously.The                            upper GI endoscopy was accomplished with ease. The                            patient tolerated the procedure well. Scope In: 9:04:58 PM Scope Out: 9:11:51 PM Total Procedure Duration: 0 hours 6 minutes 53 seconds  Findings:      The examined esophagus was normal.      A large hiatal hernia was present.      One non-bleeding cratered gastric ulcer with no stigmata of bleeding was       found in the gastric body. The lesion was 10 mm in largest dimension.      Diffuse moderate inflammation characterized by congestion (edema) and       erythema was found in the gastric antrum. Biopsies were taken with a       cold forceps for Helicobacter pylori testing.      Scattered mild inflammation characterized by congestion (edema) and       erythema was found in the duodenal bulb.      The second portion of the duodenum was normal. Impression:               - Normal esophagus.                           -  Large hiatal hernia.                           - UGI BLEED DUE TO Non-bleeding gastric ulcer                            WITHIN THE HIATAL HERNIA POUCH(CAMERON'S ULCER)                           - Gastritis, MODERATE                           - Duodenitis, MILD.                           - Normal second portion of the duodenum. Moderate Sedation:      Moderate (conscious) sedation was administered by the endoscopy nurse       and supervised by the endoscopist. The following  parameters were       monitored: oxygen saturation, heart rate, blood pressure, and response       to care. Total physician intraservice time was 11 minutes. Recommendation:           - Return patient to hospital ward for ongoing care.                           - Resume previous diet.                           - Use Protonix (pantoprazole) 40 mg PO BID.                           - Await pathology results.                           - Repeat upper endoscopy in 3 months for                            surveillance.                           - Return to GI office in 2 months.                           - - No aspirin, ibuprofen, naproxen, or other                            non-steroidal anti-inflammatory drugs FOR [2 MOS]                            after biopsy.                           - Patient has a contact number available for                            emergencies. The signs and symptoms of potential  delayed complications were discussed with the                            patient. Return to normal activities tomorrow.                            Written discharge instructions were provided to the                            patient. Procedure Code(s):        --- Professional ---                           [phone], Esophagogastroduodenoscopy, flexible,                            transoral; with biopsy, single or multiple                           G0500, Moderate sedation services provided by the                            same physician or other qualified health care                            professional performing a gastrointestinal                            endoscopic service that sedation supports,                            requiring the presence of an independent trained                            observer to assist in the monitoring of the                            patient's level of consciousness and physiological                            status;  initial 15 minutes of intra-service time;                            patient age 33 years or older (additional time may                            be reported with 54098, as appropriate) Diagnosis Code(s):        --- Professional ---                           K44.9, Diaphragmatic hernia without obstruction or                            gangrene  K25.9, Gastric ulcer, unspecified as acute or                            chronic, without hemorrhage or perforation                           K29.70, Gastritis, unspecified, without bleeding                           K29.80, Duodenitis without bleeding                           K92.0, Hematemesis                           K92.1, Melena (includes Hematochezia) CPT copyright 2016 [employer] Medical Association. All rights reserved. The codes documented in this report are preliminary and upon coder review may  be revised to meet current compliance requirements. Jonette Eva, MD Jonette Eva, MD 12/28/2015 9:36:56 PM This report has been signed electronically. Number of Addenda: 0

## 2015-12-29 NOTE — Progress Notes (Signed)
[facility] PROGRESS NOTE  Kimberly SalinesKay B Hardenbrook WUJ:[id number] DOB: Sep 04, 1945    PCP:   Rudi HeapMOORE, DONALD, MD   HPI: Kimberly Davis is an 70 y.o. female admitted for upper GI bleed requiring 2 units of PRBCs yesterday.  EGD showed non bleeding gastric ulcer, s/p biopsied.  She is able to eat and clinically has no further bleeding.  Her Hb this am was 8 g per dL.  She is currently on PPI BID.   Rewiew of Systems:  Constitutional: Negative for malaise, fever and chills. No significant weight loss or weight gain Eyes: Negative for eye pain, redness and discharge, diplopia, visual changes, or flashes of light. ENMT: Negative for ear pain, hoarseness, nasal congestion, sinus pressure and sore throat. No headaches; tinnitus, drooling, or problem swallowing. Cardiovascular: Negative for chest pain, palpitations, diaphoresis, dyspnea and peripheral edema. ; No orthopnea, PND Respiratory: Negative for cough, hemoptysis, wheezing and stridor. No pleuritic chestpain. Gastrointestinal: Negative for nausea, vomiting, diarrhea, constipation, abdominal pain, melena, blood in stool, hematemesis, jaundice and rectal bleeding.    Genitourinary: Negative for frequency, dysuria, incontinence,flank pain and hematuria; Musculoskeletal: Negative for back pain and neck pain. Negative for swelling and trauma.;  Skin: . Negative for pruritus, rash, abrasions, bruising and skin lesion.; ulcerations Neuro: Negative for headache, lightheadedness and neck stiffness. Negative for weakness, altered level of consciousness , altered mental status, extremity weakness, burning feet, involuntary movement, seizure and syncope.  Psych: negative for anxiety, depression, insomnia, tearfulness, panic attacks, hallucinations, paranoia, suicidal or homicidal ideation    Past Medical History  Diagnosis Date  . GERD (gastroesophageal reflux disease)     Past Surgical History  Procedure Laterality Date  . Tubal ligation       Medications:  HOME MEDS: Prior to Admission medications   Medication Sig Start Date End Date Taking? Authorizing Provider  Cholecalciferol (VITAMIN D PO) Take 1 tablet by mouth daily.   Yes Historical Provider, MD  Multiple Vitamin (MULTIVITAMIN WITH MINERALS) TABS tablet Take 1 tablet by mouth daily.   Yes Historical Provider, MD  VITAMIN E PO Take 1 tablet by mouth daily.   Yes Historical Provider, MD     Allergies:  No Known Allergies  Social History:   reports that she has never smoked. She does not have any smokeless tobacco history on file. She reports that she does not drink alcohol or use illicit drugs.  Family History: Family History  Problem Relation Age of Onset  . Stroke Mother   . Heart disease Father   . Colon cancer Neg Hx      Physical Exam: Filed Vitals:   12/29/15 0645 12/29/15 0700 12/29/15 0800 12/29/15 0815  BP: 86/54 96/57 82/57  93/51  Pulse: 81 89 89 97  Temp:      TempSrc:      Resp: 13 22 21 21   Height:      Weight:      SpO2: 97% 99% 100% 100%   Blood pressure 93/51, pulse 97, temperature 97.5 F (36.4 C), temperature source Oral, resp. rate 21, height 5\' 5"  (1.651 m), weight 85.4 kg (188 lb 4.4 oz), SpO2 100 %.  GEN:  Pleasant  patient lying in the stretcher in no acute distress; cooperative with exam. PSYCH:  alert and oriented x4; does not appear anxious or depressed; affect is appropriate. HEENT: Mucous membranes pink and anicteric; PERRLA; EOM intact; no cervical lymphadenopathy nor thyromegaly or carotid bruit; no JVD; There were no stridor. Neck is very supple. Breasts:: Not  examined CHEST WALL: No tenderness CHEST: Normal respiration, clear to auscultation bilaterally.  HEART: Regular rate and rhythm.  There are no murmur, rub, or gallops.   BACK: No kyphosis or scoliosis; no CVA tenderness ABDOMEN: soft and non-tender; no masses, no organomegaly, normal abdominal bowel sounds; no pannus; no intertriginous candida. There is no  rebound and no distention. Rectal Exam: Not done EXTREMITIES: No bone or joint deformity; age-appropriate arthropathy of the hands and knees; no edema; no ulcerations.  There is no calf tenderness. Genitalia: not examined PULSES: 2+ and symmetric SKIN: Normal hydration no rash or ulceration CNS: Cranial nerves 2-12 grossly intact no focal lateralizing neurologic deficit.  Speech is fluent; uvula elevated with phonation, facial symmetry and tongue midline. DTR are normal bilaterally, cerebella exam is intact, barbinski is negative and strengths are equaled bilaterally.  No sensory loss.   Labs on Admission:  Basic Metabolic Panel:  Recent Labs Lab 12/28/15 0030 12/28/15 0436  NA 132* 135  K 3.2* 3.6  CL 95* 107  CO2 24 24  GLUCOSE 232* 120*  BUN 56* 43*  CREATININE 0.94 0.58  CALCIUM 8.5* 7.6*   Liver Function Tests:  Recent Labs Lab 12/28/15 0030 12/28/15 0436  AST 19 14*  ALT 16 14  ALKPHOS 47 37*  BILITOT 1.1 1.0  PROT 6.8 5.4*  ALBUMIN 4.0 3.2*    Recent Labs Lab 12/28/15 0030  LIPASE 36   CBC:  Recent Labs Lab 12/28/15 0030 12/28/15 0705 12/28/15 1633 12/28/15 2357 12/29/15 0648  WBC 15.8* 14.2* 14.8*  --  9.9  NEUTROABS 11.8*  --   --   --  7.3  HGB 9.1* 7.1* 9.0* 7.7* 8.0*  8.1*  HCT 26.7* 21.0* 26.5* 23.1* 24.3*  24.4*  MCV 87.0 87.1 88.9  --  89.3  PLT 326 236 183  --  156   Cardiac Enzymes:  Recent Labs Lab 12/28/15 0030 12/28/15 0436  TROPONINI <0.03 <0.03    CBG:  Recent Labs Lab 12/28/15 0334 12/28/15 0730 12/28/15 1118 12/28/15 1722  GLUCAP 136* 103* 107* 121*     Radiological Exams on Admission: Dg Abd Acute W/chest  12/28/2015  CLINICAL DATA:  Nausea vomiting and constipation since yesterday. EXAM: DG ABDOMEN ACUTE W/ 1V CHEST COMPARISON:  None. FINDINGS: The abdominal gas pattern is negative for obstruction or perforation. Stool and air is present throughout the colon. No biliary or urinary calculi are evident. There  is a hiatal hernia. No acute cardiopulmonary findings are evident. IMPRESSION: Negative for obstruction or perforation. Hiatal hernia. No acute cardiopulmonary findings. Electronically Signed   By: Ellery Plunk M.D.   On: 12/28/2015 01:59   Assessment/Plan Present on Admission:  . Anemia . Nausea & vomiting . Hematemesis . Hypokalemia . Hyponatremia  PLAN:  UGIB:  From gastric ulcer.  Stable now.  Will transfer to floor.  Follow H and H q 12 hours.  Continue with PPI.  No ASA, NSAIDS.  Plan d/c tomorrow if H and H is stable.   D/C telemetry OK.   HypoK:  Supplemented.   Other plans as per orders. Code Status: FULL Unk Lightning, MD.  FACP [facility] Pager [phone] 7pm to 7am.  12/29/2015, 9:17 AM

## 2015-12-29 NOTE — Progress Notes (Signed)
Patient ID: Kimberly SalinesKay B Ostrow, female   DOB: [birth year], 70 y.o.   MRN: [id number]   Assessment/Plan: ADMITTED WITH HEMATEMESIS/MELENA AND SYNCOPE. EGD MAY 12 SHOWED CLEAN BASED GASTRIC ULCER LIKELY DUE TO ISCHEMIA, ASA USE, AND/OR H PYLORI GASTRITIS. UNABLE TO APPRECIATE A MALLORY WEISS TEAR. ADDITIONAL EROSIONS SEEN IN HIATAL HERNIA POUCH. CLINICALLY IMPROVED.  PLAN: 1. MONITOR Hb 2. BID PPI 3. ANTICIPATE DISCHARGE WITHIN THE NEXT 24 HRS 3. PLAN OUTPATIENT TCS AT TIME OF REPEAT EGD  GREATER THAN 50% WAS SPENT IN COUNSELING & COORDINATION OF CARE WITH THE PATIENT, SON, AND HUSBAND: DISCUSSED DIFFERENTIAL DIAGNOSIS, PROCEDURE, BENEFITS, AND MANAGEMENT OF GSTRIC ULCERS/GI BLEED. TOTAL ENCOUNTER TIME: 25 MINS.    Subjective: Since I last evaluated the patient SHE HAD SMALL AMOUNT OF BLACK STOOL TODAY. NO HEMATEMESIS, CHEST PAIN, OR SOB.  Objective: Vital signs in last 24 hours: Filed Vitals:   12/29/15 0800 12/29/15 0815  BP: 82/57 93/51  Pulse: 89 97  Temp:    Resp: 21 21    General appearance: alert, cooperative and no distress Resp: clear to auscultation bilaterally Cardio: regular rate and rhythm GI: soft, non-tender; bowel sounds normal;   Lab Results:   MAY 12   MAY 12  MAY 12 MAY 13 Hb 9.1  7.1  9.0  8.0-8.1    Studies/Results: No results found.  Medications: I have reviewed the patient's current medications.   LOS: 5 days   Jonette EvaSandi Fields 01/26/2014, 2:23 PM

## 2015-12-30 ENCOUNTER — Telehealth: Payer: Self-pay | Admitting: Gastroenterology

## 2015-12-30 DIAGNOSIS — K250 Acute gastric ulcer with hemorrhage: Secondary | ICD-10-CM

## 2015-12-30 MED ORDER — DOCUSATE SODIUM 100 MG PO CAPS: 100.0000 mg | ORAL_CAPSULE | Freq: Two times a day (BID) | ORAL | Status: DC

## 2015-12-30 MED ORDER — PANTOPRAZOLE SODIUM 40 MG PO TBEC: 40.0000 mg | DELAYED_RELEASE_TABLET | Freq: Two times a day (BID) | ORAL | Status: DC

## 2015-12-30 NOTE — Progress Notes (Signed)
Pt IV removed, tolerated well.  Reviewed discharge instructions and prescriptions with pt.  Answered all questions at this time.

## 2015-12-30 NOTE — Discharge Summary (Signed)
Physician Discharge Summary  Kimberly Davis ZOX:[id number] DOB: [birth year] DOA: 12/28/2015  PCP: Rudi Heap, MD  Admit date: 12/28/2015 Discharge date: 12/30/2015  Time spent: 35 minutes  Recommendations for Outpatient Follow-up:  1. Follow up with Dr Darrick Penna as scheduled.   2. Follow up with PCP next week.    Discharge Diagnoses:  Principal Problem:   Hematemesis Active Problems:   Anemia   Nausea & vomiting   Hypokalemia   Hyponatremia   Upper GI bleed   Acute blood loss anemia   Hiatal hernia   Gastric ulcer with hemorrhage   Discharge Condition: Improved.   BP is better.  No further GI Bleeding.  Hb stable at  8 g/dL.   Diet recommendation: cardiac.   Filed Weights   12/29/15 0400 12/29/15 1700 12/30/15 0620  Weight: 85.4 kg (188 lb 4.4 oz) 85.4 kg (188 lb 4.4 oz) 84.868 kg (187 lb 1.6 oz)    History of present illness: Patient was admitted by Dr Selena Batten into the ICU on Dec 28, 2015 for hematemesis.  As per his H and P:  " Kimberly Davis is a 70 y.o. female, apparently was at home c/o n/v, hematemesis. Pt notes that she has had some black stool. Yesterday. Denies nsaid use. Pt denies fever, chills, abd pain, diarrhea. Brbpr. Pt has not had any prior colonscopy or EGD. No hx of PUD. Pt was brought to to ED by her daughter "[name]" and she was noted to be anemic with hgb of 9.1. Heme positive. Pt was noted to be hypokalemic. GI has been paged per ED. Pt will be admitted for w/up of hematemsis.    Hospital Course: Kimberly Davis is an 70 y.o. female admitted for upper GI bleed requiring 2 units of PRBCs the fallowing day as her Hct dropped.  She was seen in consultation with GI, and Dr Jettie Booze performed an EGD showiug a non bleeding gastric ulcer.  It was biopsied. She was subsequently started on her diet, and tolerated well.  Her BP was soft, and thus she was kept in the hospital and transferred to the floor.  Her PPI drip was converted to oral PPI to be taken BID.   Her Hb  remained stable, though anemic at 8 g per dL.  She feels well and did not require transfusion.  Dr Jettie Booze made arrangement for her to follow up in the office, as she will need to have a follow up endoscopy in about 6-8 weeks.  She will avoid NSAIDS, ASA, but she can take tylenol.  She is anxious to go home, and will be discharged to home today.  She will see her PCP next week, and see GI as arranged by Dr Darrick Penna.  Thank you so much and Good Day.    Procedures:  EGD.   Consultations:  GI:   Dr Darrick Penna.   Discharge Exam: Filed Vitals:   12/29/15 2206 12/30/15 0620  BP: 115/55 111/55  Pulse: 92 97  Temp: 98.5 F (36.9 C) 98.5 F (36.9 C)  Resp: 18 18    Discharge Instructions   Current Discharge Medication List    START taking these medications   Details  docusate sodium (COLACE) 100 MG capsule Take 1 capsule (100 mg total) by mouth 2 (two) times daily. Qty: 10 capsule, Refills: 0    pantoprazole (PROTONIX) 40 MG tablet Take 1 tablet (40 mg total) by mouth 2 (two) times daily before a meal. Qty: 60 tablet, Refills: 1  CONTINUE these medications which have NOT CHANGED   Details  Cholecalciferol (VITAMIN D PO) Take 1 tablet by mouth daily.    Multiple Vitamin (MULTIVITAMIN WITH MINERALS) TABS tablet Take 1 tablet by mouth daily.      STOP taking these medications     VITAMIN E PO        No Known Allergies    The results of significant diagnostics from this hospitalization (including imaging, microbiology, ancillary and laboratory) are listed below for reference.    Significant Diagnostic Studies: Dg Abd Acute W/chest  12/28/2015  CLINICAL DATA:  Nausea vomiting and constipation since yesterday. EXAM: DG ABDOMEN ACUTE W/ 1V CHEST COMPARISON:  None. FINDINGS: The abdominal gas pattern is negative for obstruction or perforation. Stool and air is present throughout the colon. No biliary or urinary calculi are evident. There is a hiatal hernia. No acute  cardiopulmonary findings are evident. IMPRESSION: Negative for obstruction or perforation. Hiatal hernia. No acute cardiopulmonary findings. Electronically Signed   By: Ellery Plunkaniel R Mitchell M.D.   On: 12/28/2015 01:59    Microbiology: Recent Results (from the past 240 hour(s))  MRSA PCR Screening     Status: None   Collection Time: 12/28/15  2:57 AM  Result Value Ref Range Status   MRSA by PCR NEGATIVE NEGATIVE Final    Comment:        The GeneXpert MRSA Assay (FDA approved for NASAL specimens only), is one component of a comprehensive MRSA colonization surveillance program. It is not intended to diagnose MRSA infection nor to guide or monitor treatment for MRSA infections.      Labs: Basic Metabolic Panel:  Recent Labs Lab 12/28/15 0030 12/28/15 0436  NA 132* 135  K 3.2* 3.6  CL 95* 107  CO2 24 24  GLUCOSE 232* 120*  BUN 56* 43*  CREATININE 0.94 0.58  CALCIUM 8.5* 7.6*   Liver Function Tests:  Recent Labs Lab 12/28/15 0030 12/28/15 0436  AST 19 14*  ALT 16 14  ALKPHOS 47 37*  BILITOT 1.1 1.0  PROT 6.8 5.4*  ALBUMIN 4.0 3.2*    Recent Labs Lab 12/28/15 0030  LIPASE 36   CBC:  Recent Labs Lab 12/28/15 0030 12/28/15 0705 12/28/15 1633 12/28/15 2357 12/29/15 0648 12/29/15 2138  WBC 15.8* 14.2* 14.8*  --  9.9 8.7  NEUTROABS 11.8*  --   --   --  7.3  --   HGB 9.1* 7.1* 9.0* 7.7* 8.0*  8.1* 7.9*  HCT 26.7* 21.0* 26.5* 23.1* 24.3*  24.4* 23.7*  MCV 87.0 87.1 88.9  --  89.3 89.4  PLT 326 236 183  --  156 174     Recent Labs Lab 12/28/15 0030 12/28/15 0436  TROPONINI <0.03 <0.03   CBG:  Recent Labs Lab 12/28/15 0334 12/28/15 0730 12/28/15 1118 12/28/15 1722  GLUCAP 136* 103* 107* 121*    Signed:  Kalii Chesmore MD. Jerrel IvoryFACP.  [facility] 12/30/2015, 9:39 AM

## 2015-12-30 NOTE — Telephone Encounter (Signed)
PT D/C MAY 14 PRESENTED WITH HEMATEMESIS, MELENA, AND SYNCOPE. OPV 2 MOS W/ [facility] E30 GI BLEED/PUD. PLEASE CALL PT WHEN APPT IS BOOKED.

## 2015-12-31 ENCOUNTER — Telehealth: Payer: Self-pay | Admitting: Gastroenterology

## 2015-12-31 LAB — FOLATE RBC
FOLATE, RBC: 1865 ng/mL (ref >498)
Folate, Hemolysate: 387.9 ng/mL
HEMATOCRIT: 20.8 % — AB (ref 34.0–46.6)

## 2015-12-31 NOTE — Telephone Encounter (Signed)
PATIENT WAS SEEN IN HOSPITAL AND HAS FU WITH [facility] IN 2 MONTHS    [facility] DID ENDO ON PATIENT.  PATIENT WANTS TO KNOW WHAT KIND OF DIET SHE SHOULD BE EATING SINCE SHE HAS AN ULCER   PLEASE ADVISE [phone]

## 2015-12-31 NOTE — Telephone Encounter (Signed)
ON RECALL  °

## 2015-12-31 NOTE — Telephone Encounter (Signed)
Anna, please advise!

## 2016-01-01 ENCOUNTER — Encounter (HOSPITAL_COMMUNITY): Payer: Self-pay | Admitting: Gastroenterology

## 2016-01-01 NOTE — Telephone Encounter (Signed)
Do we have any handouts on PUD? This would be helpful to send. Needs to avoid NSAIDs, aspirin products. Limit foods that trigger GERD symptoms. Avoid the foods on the gastritis/GERD handout.

## 2016-01-01 NOTE — Telephone Encounter (Signed)
Pt is aware and I am mailing a couple of handouts.

## 2016-01-16 ENCOUNTER — Telehealth: Payer: Self-pay | Admitting: Gastroenterology

## 2016-01-16 MED ORDER — PANTOPRAZOLE SODIUM 40 MG PO TBEC: 40.0000 mg | DELAYED_RELEASE_TABLET | Freq: Two times a day (BID) | ORAL | Status: DC

## 2016-01-16 MED ORDER — AMOXICILLIN 500 MG PO TABS: ORAL_TABLET | ORAL | Status: DC

## 2016-01-16 MED ORDER — CLARITHROMYCIN 500 MG PO TABS: ORAL_TABLET | ORAL | Status: DC

## 2016-01-16 NOTE — Telephone Encounter (Addendum)
PLEASE CALL PT. Her stomach Bx showed H. Pylori infection. THIS BACTERIA CONTRIBUTES TO ULCERS FORMING WHEN YOU ARE USING ASA OR NSAIDS. She needs AMOXICILLIN 500 mg 2 po BID for 10 days and Biaxin 500 mg po bid for 10 days. She needs PROTONIX BID for 3 MOS then 1 po DAILY. Med side effects include NAUSEA, VOMITING, DIARRHEA, ABDOMINAL pain, and metallic taste.   - Repeat upper endoscopy in 3 months for surveillance. - Return to GI office in JUL 2017 E30 PUD/UGI BLEED/H PYLORI GASTRITIS. - - No aspirin, ibuprofen, naproxen, or other non-steroidal anti-inflammatory drugs FOR UNTIL AUG 2017.

## 2016-01-17 ENCOUNTER — Encounter: Payer: Self-pay | Admitting: Gastroenterology

## 2016-01-17 NOTE — Telephone Encounter (Signed)
PT is aware.

## 2016-01-17 NOTE — Telephone Encounter (Signed)
appt made and letter sent, on recall

## 2016-03-06 ENCOUNTER — Ambulatory Visit: Payer: Medicare Other | Admitting: Gastroenterology

## 2016-04-02 ENCOUNTER — Other Ambulatory Visit: Payer: Self-pay

## 2016-04-02 ENCOUNTER — Ambulatory Visit (INDEPENDENT_AMBULATORY_CARE_PROVIDER_SITE_OTHER): Payer: Medicare Other | Admitting: Gastroenterology

## 2016-04-02 ENCOUNTER — Encounter: Payer: Self-pay | Admitting: Gastroenterology

## 2016-04-02 DIAGNOSIS — K219 Gastro-esophageal reflux disease without esophagitis: Secondary | ICD-10-CM | POA: Diagnosis not present

## 2016-04-02 DIAGNOSIS — K254 Chronic or unspecified gastric ulcer with hemorrhage: Secondary | ICD-10-CM

## 2016-04-02 DIAGNOSIS — K279 Peptic ulcer, site unspecified, unspecified as acute or chronic, without hemorrhage or perforation: Secondary | ICD-10-CM

## 2016-04-02 NOTE — Assessment & Plan Note (Signed)
SYMPTOMS FAIRLY WELL CONTROLLED UNLESS SHE EATS SPICY FOOD.  CONTINUE PROTONIX. TAKE 30 MINUTES PRIOR TO BREAKFAST AND AS NEEDED.

## 2016-04-02 NOTE — Progress Notes (Signed)
cc'ed to pcp °

## 2016-04-02 NOTE — Progress Notes (Signed)
   Subjective:    Patient ID: Kimberly Davis, female    DOB: 05/02/1946, 70 y.o.   MRN: [id number]  MOORE, DONALD, MD   HPI No questions or concerns. BMs:1-2X/DAY-NL. TOOK ABX WITH PROTONIX AND NOW COMPLETED ABX. NO SIDE EFFECTS. BEEN TAKING PROTONIX BID SINCE MAY 2017. OCCASIONAL HEARTBURN: AFTER EATING SOMETHING SPICY(~2X/MO). MAY HAVE CRAMPING IN HER FEET. NO CONSTIPATION SINCE BEING ON STOOL SOFTENER BID.  PT DENIES FEVER, CHILLS, HEMATOCHEZIA, HEMATEMESIS, nausea, vomiting, melena, diarrhea, CHEST PAIN, SHORTNESS OF BREATH,  CHANGE IN BOWEL IN HABITS, constipation, abdominal pain, problems swallowing, OR problems with sedation.   Past Medical History:  Diagnosis Date  . GERD (gastroesophageal reflux disease)     Past Surgical History:  Procedure Laterality Date  . ESOPHAGOGASTRODUODENOSCOPY N/A 12/28/2015   Procedure: ESOPHAGOGASTRODUODENOSCOPY (EGD);  Surgeon: Garrie Woodin L Dossie Ocanas, MD;  Location: [facility];  Service: Endoscopy;  Laterality: N/A;  . TUBAL LIGATION      No Known Allergies  Current Outpatient Prescriptions  Medication Sig Dispense Refill  . Calcium 600-200 MG-UNIT tablet Take 1 tablet by mouth 2 (two) times daily.    . Cholecalciferol (VITAMIN D PO) Take 1 tablet by mouth daily.    . docusate sodium (COLACE) 100 MG capsule Take 1 capsule (100 mg total) by mouth 2 (two) times daily. 10 capsule 0  . Magnesium 250 MG TABS Take 250 mg by mouth daily.    . Multiple Vitamin (MULTIVITAMIN WITH MINERALS) TABS tablet Take 1 tablet by mouth daily.    . pantoprazole (PROTONIX) 40 MG tablet Take 1 tablet (40 mg total) by mouth 2 (two) times daily before a meal. 60 tablet 11   No current facility-administered medications for this visit.     Review of Systems PER HPI OTHERWISE ALL SYSTEMS ARE NEGATIVE.     Objective:   Physical Exam  Constitutional: She is oriented to person, place, and time. She appears well-developed and well-nourished. No distress.  HENT:  Head:  Normocephalic and atraumatic.  Mouth/Throat: Oropharynx is clear and moist. No oropharyngeal exudate.  Eyes: Pupils are equal, round, and reactive to light. No scleral icterus.  Neck: Normal range of motion. Neck supple.  Cardiovascular: Normal rate, regular rhythm and normal heart sounds.   Pulmonary/Chest: Effort normal and breath sounds normal. No respiratory distress.  Abdominal: Soft. Bowel sounds are normal. She exhibits no distension. There is no tenderness.  Musculoskeletal: She exhibits no edema.  Lymphadenopathy:    She has no cervical adenopathy.  Neurological: She is alert and oriented to person, place, and time.  Psychiatric: She has a normal mood and affect.  Vitals reviewed.         Assessment & Plan:   

## 2016-04-02 NOTE — Patient Instructions (Addendum)
UPPER ENDOSCOPY FRI AUG 18 FOR SURVEILLANCE AND TO ASSESS HEALING. BIOPSIES FOR H PYLORI.   NO ASPIRIN, BC/GOODY POWDERS, IBUPROFEN/MOTRIN, OR NAPROXEN/ALEVE UNTIL AFTER UPPER ENDOSCOPY.  CONTINUE PROTONIX. TAKE 30 MINUTES PRIOR TO BREAKFAST AND TAKE A SECOND DOSE IF NEEDED.  YOUR NEXT OUTPATIENT VISIT WILL BE SCHEDULED IF NEEDED AFTER ENDOSCOPY.

## 2016-04-02 NOTE — Assessment & Plan Note (Addendum)
In SETTING OF H PYLORI GASTRITIS. COMPLETED ABX. NO SIDE EFFECTS. NO BRBPR OR MELENA.  EGD FRI AUG 18 FOR SURVEILLANCE AND TO ASSESS HEALING. BIOPSIES FOR H PYLORI. DISCUSSED PROCEDURE, BENEFITS, & RISKS: < 1% chance of medication reaction, PERFORATION, OR bleeding. CBC IN POSTOP PROTONIX ONCE DAILY HOLD ASA//NSAIDS UNTIL AFTER UPPER ENDOSCOPY. OPV TBS SCHEDULED AFTER ENDOSCOPY.

## 2016-04-04 ENCOUNTER — Encounter (HOSPITAL_COMMUNITY)
Admission: RE | Disposition: A | Discharge status: Home / Self Care | Payer: Self-pay | Source: Ambulatory Visit | Attending: Gastroenterology

## 2016-04-04 ENCOUNTER — Encounter (HOSPITAL_COMMUNITY): Payer: Self-pay | Admitting: *Deleted

## 2016-04-04 ENCOUNTER — Ambulatory Visit (HOSPITAL_COMMUNITY)
Admission: RE | Admit: 2016-04-04 | Discharge: 2016-04-04 | Disposition: A | Discharge status: Home / Self Care | Payer: Medicare Other | Source: Ambulatory Visit | Attending: Gastroenterology | Admitting: Gastroenterology

## 2016-04-04 DIAGNOSIS — K449 Diaphragmatic hernia without obstruction or gangrene: Secondary | ICD-10-CM | POA: Diagnosis not present

## 2016-04-04 DIAGNOSIS — K219 Gastro-esophageal reflux disease without esophagitis: Secondary | ICD-10-CM | POA: Diagnosis not present

## 2016-04-04 DIAGNOSIS — K2950 Unspecified chronic gastritis without bleeding: Secondary | ICD-10-CM | POA: Insufficient documentation

## 2016-04-04 DIAGNOSIS — K279 Peptic ulcer, site unspecified, unspecified as acute or chronic, without hemorrhage or perforation: Secondary | ICD-10-CM

## 2016-04-04 DIAGNOSIS — K2970 Gastritis, unspecified, without bleeding: Secondary | ICD-10-CM

## 2016-04-04 DIAGNOSIS — Z8711 Personal history of peptic ulcer disease: Secondary | ICD-10-CM | POA: Diagnosis not present

## 2016-04-04 DIAGNOSIS — Z79899 Other long term (current) drug therapy: Secondary | ICD-10-CM | POA: Diagnosis not present

## 2016-04-04 DIAGNOSIS — Z09 Encounter for follow-up examination after completed treatment for conditions other than malignant neoplasm: Secondary | ICD-10-CM | POA: Insufficient documentation

## 2016-04-04 DIAGNOSIS — B9681 Helicobacter pylori [H. pylori] as the cause of diseases classified elsewhere: Secondary | ICD-10-CM | POA: Diagnosis not present

## 2016-04-04 HISTORY — PX: ESOPHAGOGASTRODUODENOSCOPY: SHX5428

## 2016-04-04 SURGERY — EGD (ESOPHAGOGASTRODUODENOSCOPY)
Anesthesia: Moderate Sedation

## 2016-04-04 MED ORDER — MEPERIDINE HCL 100 MG/ML IJ SOLN
INTRAMUSCULAR | Status: DC | PRN
  Administered 2016-04-04: 25 mg via INTRAVENOUS

## 2016-04-04 MED ORDER — MIDAZOLAM HCL 5 MG/5ML IJ SOLN
INTRAMUSCULAR | Status: DC | PRN
  Administered 2016-04-04 (×4): 1 mg via INTRAVENOUS

## 2016-04-04 MED ORDER — MIDAZOLAM HCL 5 MG/5ML IJ SOLN
INTRAMUSCULAR | Status: AC
  Filled 2016-04-04: qty 5

## 2016-04-04 MED ORDER — LIDOCAINE VISCOUS 2 % MT SOLN
OROMUCOSAL | Status: AC
  Filled 2016-04-04: qty 15

## 2016-04-04 MED ORDER — SODIUM CHLORIDE 0.9 % IV SOLN
INTRAVENOUS | Status: DC
Start: 2016-04-04 — End: 2016-04-04
  Administered 2016-04-04: 1000 mL via INTRAVENOUS

## 2016-04-04 MED ORDER — MEPERIDINE HCL 100 MG/ML IJ SOLN
INTRAMUSCULAR | Status: AC
  Filled 2016-04-04: qty 1

## 2016-04-04 MED ORDER — LIDOCAINE VISCOUS 2 % MT SOLN
OROMUCOSAL | Status: DC | PRN
  Administered 2016-04-04: 1 via OROMUCOSAL

## 2016-04-04 NOTE — Discharge Instructions (Addendum)
YOUR ULCERS ARE GONE. You have mild gastritis. I biopsied your stomach to make sure the H PYLORI INFECTION IS RESOLVED.    CONTINUE YOUR WEIGHT LOSS EFFORTS. LOSE TEN POUNDS  AVOID TRIGGERS FOR REFLUX. SEE INFO BELOW.  FOLLOW A LOW FAT DIET.  SEE INFO BELOW.  CONTINUE PROTONIX. TAKE 30 MINUTES PRIOR TO MEALS ONCE OR TWICE DAILY.  YOUR BIOPSY RESULTS WILL BE AVAILABLE IN MY CHART AFTER AUG 22 AND MY OFFICE WILL CONTACT YOU IN 10-14 DAYS WITH YOUR RESULTS.   YOU MAY RESUME ASPIRIN 81 MG DAILY.  WHEN I CALL YOU WITH YOUR RESULTS, WE WILL SCHEDULE YOUR FOLLOW UP APPOINTMENT.   UPPER ENDOSCOPY AFTER CARE Read the instructions outlined below and refer to this sheet in the next week. These discharge instructions provide you with general information on caring for yourself after you leave the hospital. While your treatment has been planned according to the most current medical practices available, unavoidable complications occasionally occur. If you have any problems or questions after discharge, call DR. Tandi Hanko, [phone].  ACTIVITY  You may resume your regular activity, but move at a slower pace for the next 24 hours.   Take frequent rest periods for the next 24 hours.   Walking will help get rid of the air and reduce the bloated feeling in your belly (abdomen).   No driving for 24 hours (because of the medicine (anesthesia) used during the test).   You may shower.   Do not sign any important legal documents or operate any machinery for 24 hours (because of the anesthesia used during the test).    NUTRITION  Drink plenty of fluids.   You may resume your normal diet as instructed by your doctor.   Begin with a light meal and progress to your normal diet. Heavy or fried foods are harder to digest and may make you feel sick to your stomach (nauseated).   Avoid alcoholic beverages for 24 hours or as instructed.    MEDICATIONS  You may resume your normal medications.   WHAT  YOU CAN EXPECT TODAY  Some feelings of bloating in the abdomen.   Passage of more gas than usual.    IF YOU HAD A BIOPSY TAKEN DURING THE UPPER ENDOSCOPY:  Eat a soft diet IF YOU HAVE NAUSEA, BLOATING, ABDOMINAL PAIN, OR VOMITING.    FINDING OUT THE RESULTS OF YOUR TEST Not all test results are available during your visit. DR. Darrick PennaFIELDS WILL CALL YOU WITHIN 14 DAYS OF YOUR PROCEDUE WITH YOUR RESULTS. Do not assume everything is normal if you have not heard from DR. Mica Ramdass, CALL HER OFFICE AT [phone].  SEEK IMMEDIATE MEDICAL ATTENTION AND CALL THE OFFICE: [phone] IF:  You have more than a spotting of blood in your stool.   Your belly is swollen (abdominal distention).   You are nauseated or vomiting.   You have a temperature over 101F.   You have abdominal pain or discomfort that is severe or gets worse throughout the day.      Lifestyle and home remedies TO CONTROL REFLUX SYMPTOMS You may eliminate or reduce the frequency of heartburn by making the following lifestyle changes:   Control your weight. Being overweight is a major risk factor for heartburn and GERD. Excess pounds put pressure on your abdomen, pushing up your stomach and causing acid to back up into your esophagus.    Eat smaller meals. 4 TO 6 MEALS A DAY. This reduces pressure on the lower esophageal sphincter,  helping to prevent the valve from opening and acid from washing back into your esophagus.    Loosen your belt. Clothes that fit tightly around your waist put pressure on your abdomen and the lower esophageal sphincter.     Eliminate heartburn triggers. Everyone has specific triggers.Common triggers such as fatty or fried foods, spicy food, tomato sauce, carbonated beverages, alcohol, chocolate, mint, garlic, onion, caffeine and nicotine may make heartburn worse.    Avoid stooping or bending. Tying your shoes is OK. Bending over for longer periods to weed your garden isn't, especially soon  after eating.    Don't lie down OR BEND OVER after a meal. Wait at least three to four hours after eating before going to bed, and don't lie down right after eating.   Alternative medicine  Several home remedies exist for treating GERD, but they provide only temporary relief. They include drinking baking soda (sodium bicarbonate) added to water or drinking other fluids such as baking soda mixed with cream of tartar and water.  Although these liquids create temporary relief by neutralizing, washing away or buffering acids, eventually they aggravate the situation by adding gas and fluid to your stomach, increasing pressure and causing more acid reflux. Further, adding more sodium to your diet may increase your blood pressure and add stress to your heart, and excessive bicarbonate ingestion can alter the acid-base balance in your body.   Low-Fat Diet  BREADS, CEREALS, PASTA, RICE, DRIED PEAS, AND BEANS These products are high in carbohydrates and most are low in fat. Therefore, they can be increased in the diet as substitutes for fatty foods. They too, however, contain calories and should not be eaten in excess. Cereals can be eaten for snacks as well as for breakfast.  Include foods that contain fiber (fruits, vegetables, whole grains, and legumes). Research shows that fiber may lower blood cholesterol levels, especially the water-soluble fiber found in fruits, vegetables, oat products, and legumes.  FRUITS AND VEGETABLES It is good to eat fruits and vegetables. Besides being sources of fiber, both are rich in vitamins and some minerals. They help you get the daily allowances of these nutrients. Fruits and vegetables can be used for snacks and desserts.  MEATS Limit lean meat, chicken, [place], and fish to no more than 6 ounces per day.  Beef, Pork, and Lamb Use lean cuts of beef, pork, and lamb. Lean cuts include:  Extra-lean ground beef.  Arm roast.  Sirloin tip.  Center-cut ham.  Round  steak.  Loin chops.  Rump roast.  Tenderloin.  Trim all fat off the outside of meats before cooking. It is not necessary to severely decrease the intake of red meat, but lean choices should be made. Lean meat is rich in protein and contains a highly absorbable form of iron. Premenopausal women, in particular, should avoid reducing lean red meat because this could increase the risk for low red blood cells (iron-deficiency anemia).  Chicken and [place] These are good sources of protein. The fat of poultry can be reduced by removing the skin and underlying fat layers before cooking. Chicken and [place] can be substituted for lean red meat in the diet. Poultry should not be fried or covered with high-fat sauces.  Fish and Shellfish Fish is a good source of protein. Shellfish contain cholesterol, but they usually are low in saturated fatty acids. The preparation of fish is important. Like chicken and [place], they should not be fried or covered with high-fat sauces.  EGGS Egg whites  contain no fat or cholesterol. They can be eaten often. Try 1 to 2 egg whites instead of whole eggs in recipes or use egg substitutes that do not contain yolk.  MILK AND DAIRY PRODUCTS Use skim or 1% milk instead of 2% or whole milk. Decrease whole milk, natural, and processed cheeses. Use nonfat or low-fat (2%) cottage cheese or low-fat cheeses made from vegetable oils. Choose nonfat or low-fat (1 to 2%) yogurt. Experiment with evaporated skim milk in recipes that call for heavy cream. Substitute low-fat yogurt or low-fat cottage cheese for sour cream in dips and salad dressings. Have at least 2 servings of low-fat dairy products, such as 2 glasses of skim (or 1%) milk each day to help get your daily calcium intake.  FATS AND OILS Reduce the total intake of fats, especially saturated fat. Butterfat, lard, and beef fats are high in saturated fat and cholesterol. These should be avoided as much as possible. Vegetable fats do  not contain cholesterol, but certain vegetable fats, such as coconut oil, palm oil, and palm kernel oil are very high in saturated fats. These should be limited. These fats are often used in bakery goods, processed foods, popcorn, oils, and nondairy creamers. Vegetable shortenings and some peanut butters contain hydrogenated oils, which are also saturated fats. Read the labels on these foods and check for saturated vegetable oils.  Unsaturated vegetable oils and fats do not raise blood cholesterol. However, they should be limited because they are fats and are high in calories. Total fat should still be limited to 30% of your daily caloric intake. Desirable liquid vegetable oils are corn oil, cottonseed oil, olive oil, canola oil, safflower oil, soybean oil, and sunflower oil. Peanut oil is not as good, but small amounts are acceptable. Buy a heart-healthy tub margarine that has no partially hydrogenated oils in the ingredients. Mayonnaise and salad dressings often are made from unsaturated fats, but they should also be limited because of their high calorie and fat content. Seeds, nuts, peanut butter, olives, and avocados are high in fat, but the fat is mainly the unsaturated type. These foods should be limited mainly to avoid excess calories and fat.  OTHER EATING TIPS Snacks  Most sweets should be limited as snacks. They tend to be rich in calories and fats, and their caloric content outweighs their nutritional value. Some good choices in snacks are graham crackers, melba toast, soda crackers, bagels (no egg), English muffins, fruits, and vegetables. These snacks are preferable to snack crackers, [place] fries, and chips. Popcorn should be air-popped or cooked in small amounts of liquid vegetable oil.  Desserts Eat fruit, low-fat yogurt, and fruit ices instead of pastries, cake, and cookies. Sherbet, angel food cake, gelatin dessert, frozen low-fat yogurt, or other frozen products that do not contain  saturated fat (pure fruit juice bars, frozen ice pops) are also acceptable.   COOKING METHODS Choose those methods that use little or no fat. They include: Poaching.  Braising.  Steaming.  Grilling.  Baking.  Stir-frying.  Broiling.  Microwaving.  Foods can be cooked in a nonstick pan without added fat, or use a nonfat cooking spray in regular cookware. Limit fried foods and avoid frying in saturated fat. Add moisture to lean meats by using water, broth, cooking wines, and other nonfat or low-fat sauces along with the cooking methods mentioned above. Soups and stews should be chilled after cooking. The fat that forms on top after a few hours in the refrigerator should be skimmed  off. When preparing meals, avoid using excess salt. Salt can contribute to raising blood pressure in some people.  EATING AWAY FROM HOME Order entres, potatoes, and vegetables without sauces or butter. When meat exceeds the size of a deck of cards (3 to 4 ounces), the rest can be taken home for another meal. Choose vegetable or fruit salads and ask for low-calorie salad dressings to be served on the side. Use dressings sparingly. Limit high-fat toppings, such as bacon, crumbled eggs, cheese, sunflower seeds, and olives. Ask for heart-healthy tub margarine instead of butter.

## 2016-04-04 NOTE — H&P (View-Only) (Signed)
   Subjective:    Patient ID: Kimberly SalinesKay B Kulkarni, female    DOB: 1945/08/24, 70 y.o.   MRN: [id number]  Rudi HeapMOORE, DONALD, MD   HPI No questions or concerns. BMs:1-2X/DAY-NL. TOOK ABX WITH PROTONIX AND NOW COMPLETED ABX. NO SIDE EFFECTS. BEEN TAKING PROTONIX BID SINCE MAY 2017. OCCASIONAL HEARTBURN: AFTER EATING SOMETHING SPICY(~2X/MO). MAY HAVE CRAMPING IN HER FEET. NO CONSTIPATION SINCE BEING ON STOOL SOFTENER BID.  PT DENIES FEVER, CHILLS, HEMATOCHEZIA, HEMATEMESIS, nausea, vomiting, melena, diarrhea, CHEST PAIN, SHORTNESS OF BREATH,  CHANGE IN BOWEL IN HABITS, constipation, abdominal pain, problems swallowing, OR problems with sedation.   Past Medical History:  Diagnosis Date  . GERD (gastroesophageal reflux disease)     Past Surgical History:  Procedure Laterality Date  . ESOPHAGOGASTRODUODENOSCOPY N/A 12/28/2015   Procedure: ESOPHAGOGASTRODUODENOSCOPY (EGD);  Surgeon: West BaliSandi L Fields, MD;  [facility];  Service: Endoscopy;  Laterality: N/A;  . TUBAL LIGATION      No Known Allergies  Current Outpatient Prescriptions  Medication Sig Dispense Refill  . Calcium 600-200 MG-UNIT tablet Take 1 tablet by mouth 2 (two) times daily.    . Cholecalciferol (VITAMIN D PO) Take 1 tablet by mouth daily.    Marland Kitchen. docusate sodium (COLACE) 100 MG capsule Take 1 capsule (100 mg total) by mouth 2 (two) times daily. 10 capsule 0  . Magnesium 250 MG TABS Take 250 mg by mouth daily.    . Multiple Vitamin (MULTIVITAMIN WITH MINERALS) TABS tablet Take 1 tablet by mouth daily.    . pantoprazole (PROTONIX) 40 MG tablet Take 1 tablet (40 mg total) by mouth 2 (two) times daily before a meal. 60 tablet 11   No current facility-administered medications for this visit.     Review of Systems PER HPI OTHERWISE ALL SYSTEMS ARE NEGATIVE.     Objective:   Physical Exam  Constitutional: She is oriented to person, place, and time. She appears well-developed and well-nourished. No distress.  HENT:  Head:  Normocephalic and atraumatic.  Mouth/Throat: Oropharynx is clear and moist. No oropharyngeal exudate.  Eyes: Pupils are equal, round, and reactive to light. No scleral icterus.  Neck: Normal range of motion. Neck supple.  Cardiovascular: Normal rate, regular rhythm and normal heart sounds.   Pulmonary/Chest: Effort normal and breath sounds normal. No respiratory distress.  Abdominal: Soft. Bowel sounds are normal. She exhibits no distension. There is no tenderness.  Musculoskeletal: She exhibits no edema.  Lymphadenopathy:    She has no cervical adenopathy.  Neurological: She is alert and oriented to person, place, and time.  Psychiatric: She has a normal mood and affect.  Vitals reviewed.         Assessment & Plan:

## 2016-04-04 NOTE — Op Note (Signed)
[facility] Patient Name: Kimberly Davis Procedure Date: 04/04/2016 3:34 PM MRN: [id number] Date of Birth: May 02, 1946 Attending MD: Kimberly Davis , MD CSN: [id number] Age: [id number] Admit Type: Outpatient Procedure:                Upper GI endoscopy WITH COLD FORCEPS BIOPSY Indications:              Follow-up of Helicobacter pylori, Follow-up of                            peptic ulcer Providers:                Kimberly EvaSandi Adalyna Godbee, MD, Jannett CelestineAnitra Bell, RN Referring MD:             Ernestina Pennaonald W. Moore Medicines:                Meperidine 25 mg IV, Midazolam 4 mg IV Complications:            No immediate complications. Estimated Blood Loss:     Estimated blood loss was minimal. Procedure:                Pre-Anesthesia Assessment:                           - Prior to the procedure, a History and Physical                            was performed, and patient medications and                            allergies were reviewed. The patient's tolerance of                            previous anesthesia was also reviewed. The risks                            and benefits of the procedure and the sedation                            options and risks were discussed with the patient.                            All questions were answered, and informed consent                            was obtained. Prior Anticoagulants: The patient has                            taken no previous anticoagulant or antiplatelet                            agents. ASA Grade Assessment: II - A patient with                            mild systemic disease. After reviewing the risks  and benefits, the patient was deemed in                            satisfactory condition to undergo the procedure.                            After obtaining informed consent, the endoscope was                            passed under direct vision. Throughout the                            procedure, the patient's blood pressure, pulse,  and                            oxygen saturations were monitored continuously. The                            [id number] ([id number]) scope was introduced through the                            mouth, and advanced to the second part of duodenum.                            The upper GI endoscopy was accomplished without                            difficulty. The patient tolerated the procedure                            well. Scope In: 3:59:19 PM Scope Out: 4:05:24 PM Total Procedure Duration: 0 hours 6 minutes 5 seconds  Findings:      The examined esophagus was normal.      A medium-sized hiatal hernia was present.      Scattered mild inflammation characterized by congestion (edema) and       erythema was found in the entire examined stomach. Biopsies were taken       with a cold forceps for Helicobacter pylori testing.      The examined duodenum was normal. Impression:               - Medium-sized hiatal hernia.                           - MILD Gastritis.                           - PUD RESOLVED Moderate Sedation:      Moderate (conscious) sedation was administered by the endoscopy nurse       and supervised by the endoscopist. The following parameters were       monitored: oxygen saturation, heart rate, blood pressure, and response       to care. Total physician intraservice time was 17 minutes. Recommendation:           - Low fat diet.                           -  Continue present medications.                           - Await pathology results.                           - Return to my office at appointment to be                            scheduled.                           - Patient has a contact number available for                            emergencies. The signs and symptoms of potential                            delayed complications were discussed with the                            patient. Return to normal activities tomorrow.                            Written discharge  instructions were provided to the                            patient. Procedure Code(s):        --- Professional ---                           [phone], Esophagogastroduodenoscopy, flexible,                            transoral; with biopsy, single or multiple                           99152, Moderate sedation services provided by the                            same physician or other qualified health care                            professional performing the diagnostic or                            therapeutic service that the sedation supports,                            requiring the presence of an independent trained                            observer to assist in the monitoring of the                            patient's level of consciousness and  physiological                            status; initial 15 minutes of intraservice time,                            patient age 38 years or older Diagnosis Code(s):        --- Professional ---                           K44.9, Diaphragmatic hernia without obstruction or                            gangrene                           K29.70, Gastritis, unspecified, without bleeding                           B96.81, Helicobacter pylori [H. pylori] as the                            cause of diseases classified elsewhere                           K27.9, Peptic ulcer, site unspecified, unspecified                            as acute or chronic, without hemorrhage or                            perforation CPT copyright 2016 American Medical Association. All rights reserved. The codes documented in this report are preliminary and upon coder review may  be revised to meet current compliance requirements. Kimberly EvaSandi Kaytie Ratcliffe, MD Kimberly EvaSandi Adaleen Hulgan, MD 04/04/2016 4:25:55 PM This report has been signed electronically. Number of Addenda: 0

## 2016-04-04 NOTE — Interval H&P Note (Signed)
History and Physical Interval Note:  04/04/2016 3:45 PM  Kimberly SalinesKay B Hackler  has presented today for surgery, with the diagnosis of PUD/H.PYLORI  The various methods of treatment have been discussed with the patient and family. After consideration of risks, benefits and other options for treatment, the patient has consented to  Procedure(s) with comments: ESOPHAGOGASTRODUODENOSCOPY (EGD) (N/A) - 300 as a surgical intervention .  The patient's history has been reviewed, patient examined, no change in status, stable for surgery.  I have reviewed the patient's chart and labs.  Questions were answered to the patient's satisfaction.     Eaton CorporationSandi Amarius Toto

## 2016-04-09 ENCOUNTER — Encounter (HOSPITAL_COMMUNITY): Payer: Self-pay | Admitting: Gastroenterology

## 2016-04-10 ENCOUNTER — Telehealth: Payer: Self-pay | Admitting: Gastroenterology

## 2016-04-10 NOTE — Telephone Encounter (Signed)
Reminder in epic to follow up in 6 months. Does patient need to be triaged for first colonoscopy or does she need an OV first. SF is wanting her to have colonoscopy within next 3 months. Please advise.

## 2016-04-10 NOTE — Telephone Encounter (Signed)
PLEASE CALL PT. HER H PYLORI INFECTION IS RESOLVED.   CONTINUE YOUR WEIGHT LOSS EFFORTS. LOSE TEN POUNDS  AVOID TRIGGERS FOR REFLUX.   FOLLOW A LOW FAT DIET.    CONTINUE PROTONIX. TAKE 30 MINUTES PRIOR TO MEALS ONCE OR TWICE DAILY.  YOU MAY RESUME ASPIRIN 81 MG DAILY.  IF SHE HAS NEVER HAD A COLONOSCOPY SHE SHOULD COMPLETE ONE WITHIN THE NEXT 3 MOS.  FOLLOW UP IN 6 MOS.

## 2016-04-11 NOTE — Telephone Encounter (Signed)
LM for pt to call

## 2016-04-17 ENCOUNTER — Telehealth: Payer: Self-pay

## 2016-04-17 NOTE — Telephone Encounter (Signed)
Letter mailed to pt to call.  

## 2016-04-17 NOTE — Telephone Encounter (Signed)
PT called and is aware of her results and will call to schedule first ever colonoscopy when she is ready.

## 2016-06-23 ENCOUNTER — Encounter: Payer: Self-pay | Admitting: Gastroenterology

## 2016-08-13 ENCOUNTER — Encounter: Payer: Self-pay | Admitting: Gastroenterology

## 2016-08-22 ENCOUNTER — Ambulatory Visit: Payer: Self-pay | Admitting: Pediatrics

## 2016-09-02 ENCOUNTER — Telehealth: Payer: Self-pay | Admitting: Gastroenterology

## 2016-09-02 DIAGNOSIS — K529 Noninfective gastroenteritis and colitis, unspecified: Secondary | ICD-10-CM | POA: Diagnosis not present

## 2016-09-02 NOTE — Telephone Encounter (Signed)
PT is aware.

## 2016-09-02 NOTE — Telephone Encounter (Signed)
Pt's daughter, Selena BattenKim, called to say that her mother has been treated for ulcers and is now having vomiting and diarrhea. She doesn't know if its a stomach bug or something else going on and wanted to know what SF recommended. Please call patient at [phone]

## 2016-09-02 NOTE — Telephone Encounter (Signed)
I called and spoke with Kimberly Davis and she said she had some nausea yesterday, vomited a little x 1, but had dry heaves. She had 4-5 episodes of diarrhea but it was only a small amount at the time. None today. She still feels sick to her stomach and has eaten 2 Ritz crackers and drank some water. She was chilled earlier this morning but not now. She does not have a thermometer, but has felt a little warm. Please advise!  She said she was also supposed to schedule a follow up in February with Dr. Darrick PennaFields. I scheduled her for 09/25/2016 at 10:00 AM.

## 2016-09-02 NOTE — Telephone Encounter (Addendum)
PLEASE CALL PT. IF SHE IS FEELING ILL, SHE SHOULD BE TESTED FOR THE FLU AT HER PCPs OFFICE. DRINK WATER TO KEEP YOUR URINE LIGHT YELLOW. SHE CAN LET US KNOW IF SHE NEEDS SOMETHING FOR NAUSEA OR VOMITING. SHE CAN TAKE REG STRENGTH TYLENOL 2 PO TID FOR 3 DAYS TO PREVENT FEVER ACHES/PAINS.

## 2016-09-05 ENCOUNTER — Encounter: Payer: Self-pay | Admitting: Physician Assistant

## 2016-09-05 ENCOUNTER — Ambulatory Visit (INDEPENDENT_AMBULATORY_CARE_PROVIDER_SITE_OTHER): Payer: PPO | Admitting: Physician Assistant

## 2016-09-05 VITALS — BP 86/64 | HR 86 | Temp 98.0°F | Ht 64.5 in | Wt 177.6 lb

## 2016-09-05 DIAGNOSIS — R110 Nausea: Secondary | ICD-10-CM | POA: Diagnosis not present

## 2016-09-05 DIAGNOSIS — K529 Noninfective gastroenteritis and colitis, unspecified: Secondary | ICD-10-CM | POA: Diagnosis not present

## 2016-09-05 DIAGNOSIS — Z8719 Personal history of other diseases of the digestive system: Secondary | ICD-10-CM

## 2016-09-05 DIAGNOSIS — R1110 Vomiting, unspecified: Secondary | ICD-10-CM | POA: Diagnosis not present

## 2016-09-05 MED ORDER — PROMETHAZINE HCL 25 MG PO TABS: 25.0000 mg | ORAL_TABLET | Freq: Four times a day (QID) | ORAL | 0 refills | Status: DC | PRN

## 2016-09-05 NOTE — Patient Instructions (Signed)

## 2016-09-06 ENCOUNTER — Telehealth: Payer: Self-pay | Admitting: Physician Assistant

## 2016-09-06 DIAGNOSIS — R112 Nausea with vomiting, unspecified: Secondary | ICD-10-CM

## 2016-09-06 DIAGNOSIS — R1010 Upper abdominal pain, unspecified: Secondary | ICD-10-CM

## 2016-09-06 DIAGNOSIS — R634 Abnormal weight loss: Secondary | ICD-10-CM

## 2016-09-06 MED ORDER — ONDANSETRON 4 MG PO TBDP: 4.0000 mg | ORAL_TABLET | Freq: Three times a day (TID) | ORAL | 0 refills | Status: DC | PRN

## 2016-09-06 MED ORDER — OMEPRAZOLE 20 MG PO CPDR: 20.0000 mg | DELAYED_RELEASE_CAPSULE | Freq: Every day | ORAL | 3 refills | Status: DC

## 2016-09-07 DIAGNOSIS — R1110 Vomiting, unspecified: Secondary | ICD-10-CM | POA: Insufficient documentation

## 2016-09-07 DIAGNOSIS — Z8719 Personal history of other diseases of the digestive system: Secondary | ICD-10-CM | POA: Insufficient documentation

## 2016-09-07 DIAGNOSIS — R110 Nausea: Secondary | ICD-10-CM | POA: Insufficient documentation

## 2016-09-07 NOTE — Progress Notes (Signed)
BP (!) 86/64   Pulse 86   Temp 98 F (36.7 C) (Oral)   Ht 5' 4.5" (1.638 m)   Wt 177 lb 9.6 oz (80.6 kg)   BMI 30.01 kg/m    Subjective:    Patient ID: Kimberly Davis, female    DOB: 03/14/1946, 71 y.o.   MRN: [id number]  Kimberly Davis is a 71 y.o. female presenting on 09/05/2016 for Nausea (Patient went to urgent care Tuesday with Stomach Virus. Patient is just not feeling better. Vomiting stopped yesterday and Diarrhea stopped Tuesday. ); No appetite; Emesis (Patient had vomititng and Diarrhea Monday and Tuesday. Went to urgent care told she had a virus); Diarrhea; and New Patient (Initial Visit)   Emesis   This is a chronic problem. The current episode started in the past 7 days. The problem has been unchanged. The emesis has an appearance of bile. There has been no fever. Associated symptoms include abdominal pain, diarrhea and dizziness. Pertinent negatives include no chest pain, fever or headaches. She has tried increased fluids and diet change for the symptoms. The treatment provided no relief.  Diarrhea   Associated symptoms include abdominal pain and vomiting. Pertinent negatives include no fever or headaches.    Past Medical History:  Diagnosis Date  . GERD (gastroesophageal reflux disease)    Relevant past medical, surgical, family and social history reviewed and updated as indicated. Interim medical history since our last visit reviewed. Allergies and medications reviewed and updated.   Data reviewed from any sources in EPIC.  Review of Systems  Constitutional: Positive for appetite change, fatigue and unexpected weight change. Negative for fever.  HENT: Negative.   Eyes: Negative.   Respiratory: Negative.   Cardiovascular: Negative.  Negative for chest pain, palpitations and leg swelling.  Gastrointestinal: Positive for abdominal distention, abdominal pain, diarrhea, nausea and vomiting.  Genitourinary: Negative.   Skin: Negative.   Neurological: Positive for  dizziness. Negative for headaches.     Social History   Social History  . Marital status: Married    Spouse name: N/A  . Number of children: 3  . Years of education: N/A   Occupational History  . [employer]    Social History Main Topics  . Smoking status: Never Smoker  . Smokeless tobacco: Never Used  . Alcohol use No  . Drug use: No  . Sexual activity: Not on file   Other Topics Concern  . Not on file   Social History Narrative  . No narrative on file    Past Surgical History:  Procedure Laterality Date  . ESOPHAGOGASTRODUODENOSCOPY N/A 12/28/2015   Procedure: ESOPHAGOGASTRODUODENOSCOPY (EGD);  Surgeon: West BaliSandi L Fields, MD;  [facility];  Service: Endoscopy;  Laterality: N/A;  . ESOPHAGOGASTRODUODENOSCOPY N/A 04/04/2016   Procedure: ESOPHAGOGASTRODUODENOSCOPY (EGD);  Surgeon: West BaliSandi L Fields, MD;  [facility]: [facility];  Service: Endoscopy;  Laterality: N/A;  300  . TUBAL LIGATION      Family History  Problem Relation Age of Onset  . Stroke Mother   . Heart disease Father   . Colon cancer Neg Hx     Allergies as of 09/05/2016   No Known Allergies     Medication List       Accurate as of 09/05/16 11:59 PM. Always use your most recent med list.          Calcium 600-200 MG-UNIT tablet Take 1 tablet by mouth 2 (two) times daily.   docusate sodium 100 MG capsule  Commonly known as:  COLACE Take 1 capsule (100 mg total) by mouth 2 (two) times daily.   Magnesium 250 MG Tabs Take 250 mg by mouth daily.   multivitamin with minerals Tabs tablet Take 1 tablet by mouth daily.   promethazine 25 MG tablet Commonly known as:  PHENERGAN Take 1 tablet (25 mg total) by mouth every 6 (six) hours as needed for nausea or vomiting.          Objective:    BP (!) 86/64   Pulse 86   Temp 98 F (36.7 C) (Oral)   Ht 5' 4.5" (1.638 m)   Wt 177 lb 9.6 oz (80.6 kg)   BMI 30.01 kg/m   No Known Allergies Wt Readings from Last 3 Encounters:  09/05/16  177 lb 9.6 oz (80.6 kg)  04/04/16 181 lb (82.1 kg)  04/02/16 181 lb 6.4 oz (82.3 kg)    Physical Exam  Constitutional: She is oriented to person, place, and time. She appears well-developed and well-nourished.  HENT:  Head: Normocephalic and atraumatic.  Eyes: Conjunctivae and EOM are normal. Pupils are equal, round, and reactive to light.  Cardiovascular: Normal rate, regular rhythm, normal heart sounds and intact distal pulses.   Pulmonary/Chest: Effort normal and breath sounds normal.  Abdominal: Soft. She exhibits no distension and no mass. Bowel sounds are increased. There is tenderness in the right upper quadrant, epigastric area and left upper quadrant. There is no rebound and no guarding.  Neurological: She is alert and oriented to person, place, and time. She has normal reflexes.  Skin: Skin is warm and dry. No rash noted.  Psychiatric: She has a normal mood and affect. Her behavior is normal. Judgment and thought content normal.        Assessment & Plan:   1. Gastroenteritis BRAT diet, insurancrance did not pay for zofran, tried phenergan. Called back in one day and still nausea vomiting. In another phone call Zofran was called back in. She will stop the promethazine.  2. Chronic nausea Referral to gastroenterology Dr. Darrick Penna - promethazine (PHENERGAN) 25 MG tablet; Take 1 tablet (25 mg total) by mouth every 6 (six) hours as needed for nausea or vomiting.  Dispense: 30 tablet; Refill: 0  3. Chronic vomiting Referral to Dr. Darrick Penna gastroenterology - promethazine (PHENERGAN) 25 MG tablet; Take 1 tablet (25 mg total) by mouth every 6 (six) hours as needed for nausea or vomiting.  Dispense: 30 tablet; Refill: 0  4. History of GI bleed Plan to restart omeprazole 20 mg. Medication was called in one day later in a telephone encounter.    Continue all other maintenance medications as listed above. Educational handout given for diarrhea  Follow up plan: Return if symptoms  worsen or fail to improve.  Remus Loffler PA-C [facility] [address]  [place], [place] [address] [phone]   09/07/2016, 10:14 PM

## 2016-09-07 NOTE — Telephone Encounter (Signed)
Prescription sent to pharmacy.

## 2016-09-08 NOTE — Telephone Encounter (Signed)
noted 

## 2016-09-08 NOTE — Telephone Encounter (Signed)
Today - Monday - pt back at work and feeling better   Kimberly Davis - FYI - she does have an appt with Dr Evelina DunField's the first week of FEB

## 2016-09-11 ENCOUNTER — Telehealth: Payer: Self-pay

## 2016-09-11 NOTE — Telephone Encounter (Signed)
noted 

## 2016-09-12 ENCOUNTER — Ambulatory Visit (HOSPITAL_COMMUNITY): Payer: PPO

## 2016-09-25 ENCOUNTER — Encounter: Payer: Self-pay | Admitting: Gastroenterology

## 2016-09-25 ENCOUNTER — Ambulatory Visit (INDEPENDENT_AMBULATORY_CARE_PROVIDER_SITE_OTHER): Payer: PPO | Admitting: Gastroenterology

## 2016-09-25 VITALS — BP 123/79 | HR 73 | Temp 97.6°F | Ht 64.5 in | Wt 181.6 lb

## 2016-09-25 DIAGNOSIS — Z1211 Encounter for screening for malignant neoplasm of colon: Secondary | ICD-10-CM | POA: Diagnosis not present

## 2016-09-25 DIAGNOSIS — K254 Chronic or unspecified gastric ulcer with hemorrhage: Secondary | ICD-10-CM | POA: Diagnosis not present

## 2016-09-25 DIAGNOSIS — K219 Gastro-esophageal reflux disease without esophagitis: Secondary | ICD-10-CM | POA: Diagnosis not present

## 2016-09-25 LAB — CBC WITH DIFFERENTIAL/PLATELET
BASOS ABS: 67 {cells}/uL (ref 0–200)
BASOS PCT: 1 %
Eosinophils Absolute: 134 cells/uL (ref 15–500)
Eosinophils Relative: 2 %
HEMATOCRIT: 40.2 % (ref 35.0–45.0)
Hemoglobin: 13.2 g/dL (ref 11.7–15.5)
LYMPHS PCT: 24 %
Lymphs Abs: 1608 cells/uL (ref 850–3900)
MCH: 28.4 pg (ref 27.0–33.0)
MCHC: 32.8 g/dL (ref 32.0–36.0)
MCV: 86.6 fL (ref 80.0–100.0)
MONO ABS: 603 {cells}/uL (ref 200–950)
MONOS PCT: 9 %
MPV: 9.2 fL (ref 7.5–12.5)
NEUTROS ABS: 4288 {cells}/uL (ref 1500–7800)
Neutrophils Relative %: 64 %
PLATELETS: 315 10*3/uL (ref 140–400)
RBC: 4.64 MIL/uL (ref 3.80–5.10)
RDW: 14.2 % (ref 11.0–15.0)
WBC: 6.7 10*3/uL (ref 3.8–10.8)

## 2016-09-25 NOTE — Assessment & Plan Note (Signed)
SYMPTOMS FAIRLY WELL CONTROLLED WITH DIET AND PRN PROTONIX.  CONTINUE TO MONITOR SYMPTOMS. PROTONIX IF NEEDED

## 2016-09-25 NOTE — Patient Instructions (Addendum)
DRINK WATER TO KEEP YOUR URINE LIGHT YELLOW.  FOLLOW A HIGH FIBER DIET. AVOID ITEMS THAT CAUSE BLOATING & GAS.   I WILL CHECK YOUR BLOOD COUNT TO MAKE SURE IT HAS RETURNED TO NORMAL.  YOU SHOULD CONSIDER A COLONOSCOPY. WHEN YOU ARE READY PLEASE CALL TO SCHEDULE AND WE WILL TRIAGE YOU OVER THE PHONE.  FOLLOW UP IN 6 MOS.    Polyps, Colon  A polyp is extra tissue that grows inside your body. Colon polyps grow in the large intestine. The large intestine, also called the colon, is part of your digestive system. It is a long, hollow tube at the end of your digestive tract where your body makes and stores stool Most polyps are not dangerous. They are benign. This means they are not cancerous. But over time, some types of polyps can turn into cancer. Polyps that are smaller than a pea are usually not harmful. But larger polyps could someday become or may already be cancerous. To be safe, doctors remove all polyps and test them.   WHO GETS POLYPS? Anyone can get polyps, but certain people are more likely than others. You may have a greater chance of getting polyps if:  You are over 50.   You have had polyps before.   Someone in your family has had polyps.   Someone in your family has had cancer of the large intestine.   Find out if someone in your family has had polyps. You may also be more likely to get polyps if you:   Eat a lot of fatty foods   Smoke   Drink alcohol   Do not exercise  Eat too much    PREVENTION There is not one sure way to prevent polyps. You might be able to lower your risk of getting them if you:  Eat more fruits and vegetables and less fatty food.   Do not smoke.   Avoid alcohol.   Exercise every day.   Lose weight if you are overweight.   Eating more calcium and folate can also lower your risk of getting polyps. Some foods that are rich in calcium are milk, cheese, and broccoli. Some foods that are rich in folate are chickpeas, kidney beans, and spinach.

## 2016-09-25 NOTE — Assessment & Plan Note (Signed)
NO WARNING SIGNS/SYMPTOMS. LAST Hb MAY 2017 7.9. NO TCS EVER.  CHECK CBC.

## 2016-09-25 NOTE — Progress Notes (Signed)
   Subjective:    Patient ID: Kimberly Davis, female    DOB: October 11, 1945, 71 y.o.   MRN: [id number]  Remus LofflerAngel S Jones, PA-C   HPI Had virus couple weeks ago  and had vomiting and diarrhea. Felt back to normal since 1 week ago last SAT. IF SHE WATCHES WHAT SHE EATS SHE DOESN'T HAVE REFLUX. NEVER HAD A COLONOSCOPY.   PT DENIES FEVER, CHILLS, HEMATOCHEZIA, HEMATEMESIS, nausea, melena, diarrhea, CHEST PAIN, SHORTNESS OF BREATH,  CHANGE IN BOWEL IN HABITS, constipation, abdominal pain, problems swallowing, OR heartburn or indigestion.  Past Medical History:  Diagnosis Date  . GERD (gastroesophageal reflux disease)     Past Surgical History:  Procedure Laterality Date  . ESOPHAGOGASTRODUODENOSCOPY N/A 12/28/2015   Procedure: ESOPHAGOGASTRODUODENOSCOPY (EGD);  Surgeon: West BaliSandi L Garrett Mitchum, MD;  [facility];  Service: Endoscopy;  Laterality: N/A;  . ESOPHAGOGASTRODUODENOSCOPY N/A 04/04/2016   Procedure: ESOPHAGOGASTRODUODENOSCOPY (EGD);  Surgeon: West BaliSandi L Luman Holway, MD;  [facility];  Service: Endoscopy;  Laterality: N/A;  300  . TUBAL LIGATION      No Known Allergies  Current Outpatient Prescriptions  Medication Sig Dispense Refill  . Calcium 600-200 MG-UNIT tablet Take 1 tablet by mouth 2 (two) times daily.    Marland Kitchen. docusate sodium 100 MG capsule Take 1 capsule (100 mg total) by mouth 2 (two) times daily.    . Magnesium 250 MG TABS Take 250 mg by mouth daily.    . Multiple Vitamin (MULTIVITAMIN WITH MINERALS) TABS tablet Take 1 tablet by mouth daily.    . pantoprazole (PROTONIX) 40 MG tablet Take 40 mg by mouth IF NEEDED    .      .      .       Family History  Problem Relation Age of Onset  . Stroke Mother   . Heart disease Father   . Colon cancer Neg Hx   BROTHER AND SISTER HAD COLON POLYPS.   Review of Systems PER HPI OTHERWISE ALL SYSTEMS ARE NEGATIVE.    Objective:   Physical Exam  Constitutional: She is oriented to person, place, and time. She appears  well-developed and well-nourished. No distress.  HENT:  Head: Normocephalic and atraumatic.  Mouth/Throat: Oropharynx is clear and moist. No oropharyngeal exudate.  Eyes: Pupils are equal, round, and reactive to light. No scleral icterus.  Neck: Normal range of motion. Neck supple.  Cardiovascular: Normal rate, regular rhythm and normal heart sounds.   Pulmonary/Chest: Effort normal and breath sounds normal. No respiratory distress.  Abdominal: Soft. Bowel sounds are normal. She exhibits no distension. There is no tenderness.  Musculoskeletal: She exhibits no edema.  Lymphadenopathy:    She has no cervical adenopathy.  Neurological: She is alert and oriented to person, place, and time.  NO FOCAL DEFICITS  Psychiatric: She has a normal mood and affect.  Vitals reviewed.         Assessment & Plan:

## 2016-09-25 NOTE — Progress Notes (Signed)
CC'D TO PCP °

## 2016-09-25 NOTE — Assessment & Plan Note (Signed)
FAMILY HISTORY OF TWO FIRST DEGREE RELATIVES WITH COLON POLYPS.  DRINK WATER TO KEEP YOUR URINE LIGHT YELLOW.  FOLLOW A HIGH FIBER DIET. AVOID ITEMS THAT CAUSE BLOATING & GAS.  DISCUSSED WITH PATIENT, SHE SHOULD CONSIDER A COLONOSCOPY.SHE WILL CALL TO SCHEDULE AND WE WILL TRIAGE YOU OVER THE PHONE. DISCUSSED PROCEDURE, COLOWRAP, BENEFITS, & RISKS: < 1% chance of medication reaction, bleeding, perforation, or rupture of spleen/liver.  FOLLOW UP IN 6 MOS.

## 2016-09-25 NOTE — Progress Notes (Signed)
ON RECALL  °

## 2016-11-06 ENCOUNTER — Ambulatory Visit (INDEPENDENT_AMBULATORY_CARE_PROVIDER_SITE_OTHER): Payer: PPO | Admitting: Physician Assistant

## 2016-11-06 ENCOUNTER — Encounter: Payer: Self-pay | Admitting: Physician Assistant

## 2016-11-06 VITALS — BP 115/69 | HR 69 | Temp 97.1°F | Ht 64.5 in | Wt 185.2 lb

## 2016-11-06 DIAGNOSIS — Z0000 Encounter for general adult medical examination without abnormal findings: Secondary | ICD-10-CM | POA: Diagnosis not present

## 2016-11-06 DIAGNOSIS — K219 Gastro-esophageal reflux disease without esophagitis: Secondary | ICD-10-CM

## 2016-11-06 NOTE — Progress Notes (Signed)
BP 115/69   Pulse 69   Temp 97.1 F (36.2 C) (Oral)   Ht 5' 4.5" (1.638 m)   Wt 185 lb 3.2 oz (84 kg)   BMI 31.30 kg/m    Subjective:    Patient ID: Kimberly Davis, female    DOB: [birth year], 71 y.o.   MRN: [id number]  HPI: Kimberly Davis is a 71 y.o. female presenting on 11/06/2016 for Annual Exam  This patient comes in for annual well physical examination. All medications are reviewed today. There are no reports of any problems with the medications. All of the medical conditions are reviewed and updated.  Lab work is reviewed and will be ordered as medically necessary. There are no new problems reported with today's visit.  Patient reports doing well overall. GERD is well controlled on diet but has protonix if needed.  Will follow up with Dr. Oneida Alar if needed, fecal card given today but encouraged to get the colonoscopy. Will also contact [facility] for her mammogram, which is where she has always had them.  Past Medical History:  Diagnosis Date  . GERD (gastroesophageal reflux disease)    Relevant past medical, surgical, family and social history reviewed and updated as indicated. Interim medical history since our last visit reviewed. Allergies and medications reviewed and updated. DATA REVIEWED: CHART IN EPIC  Social History   Social History  . Marital status: Married    Spouse name: N/A  . Number of children: 3  . Years of education: N/A   Occupational History  . [employer]    Social History Main Topics  . Smoking status: Never Smoker  . Smokeless tobacco: Never Used  . Alcohol use No  . Drug use: No  . Sexual activity: Not on file   Other Topics Concern  . Not on file   Social History Narrative  . No narrative on file    Past Surgical History:  Procedure Laterality Date  . ESOPHAGOGASTRODUODENOSCOPY N/A 12/28/2015   Procedure: ESOPHAGOGASTRODUODENOSCOPY (EGD);  Surgeon: Danie Binder, MD;  Location: [facility];  Service: Endoscopy;  Laterality:  N/A;  . ESOPHAGOGASTRODUODENOSCOPY N/A 04/04/2016   Procedure: ESOPHAGOGASTRODUODENOSCOPY (EGD);  Surgeon: Danie Binder, MD;  Location: [facility];  Service: Endoscopy;  Laterality: N/A;  300  . TUBAL LIGATION      Family History  Problem Relation Age of Onset  . Stroke Mother   . Heart disease Father   . Colon cancer Neg Hx     Review of Systems  Constitutional: Negative.  Negative for activity change, fatigue and fever.  HENT: Negative.   Eyes: Negative.   Respiratory: Negative.  Negative for cough.   Cardiovascular: Negative.  Negative for chest pain.  Gastrointestinal: Negative.  Negative for abdominal pain.  Endocrine: Negative.   Genitourinary: Negative.  Negative for dysuria.  Musculoskeletal: Negative.   Skin: Negative.   Neurological: Negative.     Allergies as of 11/06/2016   No Known Allergies     Medication List       Accurate as of 11/06/16 11:02 AM. Always use your most recent med list.          Calcium 600-200 MG-UNIT tablet Take 1 tablet by mouth 2 (two) times daily.   Cod Liver Oil 1000 MG Caps Take by mouth.   docusate sodium 100 MG capsule Commonly known as:  COLACE Take 1 capsule (100 mg total) by mouth 2 (two) times daily.   Magnesium 250 MG Tabs  Take 250 mg by mouth daily.   multivitamin with minerals Tabs tablet Take 1 tablet by mouth daily.   pantoprazole 40 MG tablet Commonly known as:  PROTONIX Take 40 mg by mouth as needed.   Vitamin D-3 1000 units Caps Take by mouth.          Objective:    BP 115/69   Pulse 69   Temp 97.1 F (36.2 C) (Oral)   Ht 5' 4.5" (1.638 m)   Wt 185 lb 3.2 oz (84 kg)   BMI 31.30 kg/m   No Known Allergies  Wt Readings from Last 3 Encounters:  11/06/16 185 lb 3.2 oz (84 kg)  09/25/16 181 lb 9.6 oz (82.4 kg)  09/05/16 177 lb 9.6 oz (80.6 kg)    Physical Exam  Constitutional: She is oriented to person, place, and time. She appears well-developed and well-nourished.  HENT:  Head:  Normocephalic and atraumatic.  Eyes: Conjunctivae and EOM are normal. Pupils are equal, round, and reactive to light.  Neck: Normal range of motion. Neck supple.  Cardiovascular: Normal rate, regular rhythm, normal heart sounds and intact distal pulses.   Pulmonary/Chest: Effort normal and breath sounds normal. She exhibits no mass and no tenderness. Right breast exhibits no mass, no skin change and no tenderness. Left breast exhibits no mass, no skin change and no tenderness. Breasts are symmetrical.  Abdominal: Soft. Bowel sounds are normal.  Genitourinary: Vagina normal and uterus normal. Rectal exam shows no fissure. No breast swelling, tenderness, discharge or bleeding. There is no tenderness or lesion on the right labia. There is no tenderness or lesion on the left labia. Uterus is not deviated, not enlarged and not tender. Cervix exhibits no motion tenderness, no discharge and no friability. Right adnexum displays no mass, no tenderness and no fullness. Left adnexum displays no mass, no tenderness and no fullness. No tenderness or bleeding in the vagina. No vaginal discharge found.  Neurological: She is alert and oriented to person, place, and time. She has normal reflexes.  Skin: Skin is warm and dry. No rash noted.  Psychiatric: She has a normal mood and affect. Her behavior is normal. Judgment and thought content normal.        Assessment & Plan:   1. Well adult exam - CMP14+EGFR - CBC with Differential - Lipid panel - TSH  2. Gastroesophageal reflux disease without esophagitis   Current Outpatient Prescriptions:  .  Calcium 600-200 MG-UNIT tablet, Take 1 tablet by mouth 2 (two) times daily., Disp: , Rfl:  .  Cholecalciferol (VITAMIN D-3) 1000 units CAPS, Take by mouth., Disp: , Rfl:  .  Cod Liver Oil 1000 MG CAPS, Take by mouth., Disp: , Rfl:  .  docusate sodium (COLACE) 100 MG capsule, Take 1 capsule (100 mg total) by mouth 2 (two) times daily., Disp: 10 capsule, Rfl: 0 .   Magnesium 250 MG TABS, Take 250 mg by mouth daily., Disp: , Rfl:  .  Multiple Vitamin (MULTIVITAMIN WITH MINERALS) TABS tablet, Take 1 tablet by mouth daily., Disp: , Rfl:  .  pantoprazole (PROTONIX) 40 MG tablet, Take 40 mg by mouth as needed., Disp: , Rfl:   Continue all other maintenance medications as listed above.  Follow up plan: Return in about 1 year (around 11/06/2017) for cpe.  Educational handout given for health maintenance  Terald Sleeper PA-C [facility] [address]  [place], [place] [address] [phone]   11/06/2016, 11:02 AM

## 2016-11-06 NOTE — Patient Instructions (Signed)
Health Maintenance for Postmenopausal Women Menopause is a normal process in which your reproductive ability comes to an end. This process happens gradually over a span of months to years, usually between the ages of 33 and 38. Menopause is complete when you have missed 12 consecutive menstrual periods. It is important to talk with your health care provider about some of the most common conditions that affect postmenopausal women, such as heart disease, cancer, and bone loss (osteoporosis). Adopting a healthy lifestyle and getting preventive care can help to promote your health and wellness. Those actions can also lower your chances of developing some of these common conditions. What should I know about menopause? During menopause, you may experience a number of symptoms, such as:  Moderate-to-severe hot flashes.  Night sweats.  Decrease in sex drive.  Mood swings.  Headaches.  Tiredness.  Irritability.  Memory problems.  Insomnia. Choosing to treat or not to treat menopausal changes is an individual decision that you make with your health care provider. What should I know about hormone replacement therapy and supplements? Hormone therapy products are effective for treating symptoms that are associated with menopause, such as hot flashes and night sweats. Hormone replacement carries certain risks, especially as you become older. If you are thinking about using estrogen or estrogen with progestin treatments, discuss the benefits and risks with your health care provider. What should I know about heart disease and stroke? Heart disease, heart attack, and stroke become more likely as you age. This may be due, in part, to the hormonal changes that your body experiences during menopause. These can affect how your body processes dietary fats, triglycerides, and cholesterol. Heart attack and stroke are both medical emergencies. There are many things that you can do to help prevent heart disease  and stroke:  Have your blood pressure checked at least every 1-2 years. High blood pressure causes heart disease and increases the risk of stroke.  If you are 48-61 years old, ask your health care provider if you should take aspirin to prevent a heart attack or a stroke.  Do not use any tobacco products, including cigarettes, chewing tobacco, or electronic cigarettes. If you need help quitting, ask your health care provider.  It is important to eat a healthy diet and maintain a healthy weight.  Be sure to include plenty of vegetables, fruits, low-fat dairy products, and lean protein.  Avoid eating foods that are high in solid fats, added sugars, or salt (sodium).  Get regular exercise. This is one of the most important things that you can do for your health.  Try to exercise for at least 150 minutes each week. The type of exercise that you do should increase your heart rate and make you sweat. This is known as moderate-intensity exercise.  Try to do strengthening exercises at least twice each week. Do these in addition to the moderate-intensity exercise.  Know your numbers.Ask your health care provider to check your cholesterol and your blood glucose. Continue to have your blood tested as directed by your health care provider. What should I know about cancer screening? There are several types of cancer. Take the following steps to reduce your risk and to catch any cancer development as early as possible. Breast Cancer  Practice breast self-awareness.  This means understanding how your breasts normally appear and feel.  It also means doing regular breast self-exams. Let your health care provider know about any changes, no matter how small.  If you are 40 or older,  have a clinician do a breast exam (clinical breast exam or CBE) every year. Depending on your age, family history, and medical history, it may be recommended that you also have a yearly breast X-ray (mammogram).  If you  have a family history of breast cancer, talk with your health care provider about genetic screening.  If you are at high risk for breast cancer, talk with your health care provider about having an MRI and a mammogram every year.  Breast cancer (BRCA) gene test is recommended for women who have family members with BRCA-related cancers. Results of the assessment will determine the need for genetic counseling and BRCA1 and for BRCA2 testing. BRCA-related cancers include these types:  Breast. This occurs in males or females.  Ovarian.  Tubal. This may also be called fallopian tube cancer.  Cancer of the abdominal or pelvic lining (peritoneal cancer).  Prostate.  Pancreatic. Cervical, Uterine, and Ovarian Cancer  Your health care provider may recommend that you be screened regularly for cancer of the pelvic organs. These include your ovaries, uterus, and vagina. This screening involves a pelvic exam, which includes checking for microscopic changes to the surface of your cervix (Pap test).  For women ages 21-65, health care providers may recommend a pelvic exam and a Pap test every three years. For women ages 23-65, they may recommend the Pap test and pelvic exam, combined with testing for human papilloma virus (HPV), every five years. Some types of HPV increase your risk of cervical cancer. Testing for HPV may also be done on women of any age who have unclear Pap test results.  Other health care providers may not recommend any screening for nonpregnant women who are considered low risk for pelvic cancer and have no symptoms. Ask your health care provider if a screening pelvic exam is right for you.  If you have had past treatment for cervical cancer or a condition that could lead to cancer, you need Pap tests and screening for cancer for at least 20 years after your treatment. If Pap tests have been discontinued for you, your risk factors (such as having a new sexual partner) need to be reassessed  to determine if you should start having screenings again. Some women have medical problems that increase the chance of getting cervical cancer. In these cases, your health care provider may recommend that you have screening and Pap tests more often.  If you have a family history of uterine cancer or ovarian cancer, talk with your health care provider about genetic screening.  If you have vaginal bleeding after reaching menopause, tell your health care provider.  There are currently no reliable tests available to screen for ovarian cancer. Lung Cancer  Lung cancer screening is recommended for adults 99-83 years old who are at high risk for lung cancer because of a history of smoking. A yearly low-dose CT scan of the lungs is recommended if you:  Currently smoke.  Have a history of at least 30 pack-years of smoking and you currently smoke or have quit within the past 15 years. A pack-year is smoking an average of one pack of cigarettes per day for one year. Yearly screening should:  Continue until it has been 15 years since you quit.  Stop if you develop a health problem that would prevent you from having lung cancer treatment. Colorectal Cancer  This type of cancer can be detected and can often be prevented.  Routine colorectal cancer screening usually begins at age 72 and continues  through age 75.  If you have risk factors for colon cancer, your health care provider may recommend that you be screened at an earlier age.  If you have a family history of colorectal cancer, talk with your health care provider about genetic screening.  Your health care provider may also recommend using home test kits to check for hidden blood in your stool.  A small camera at the end of a tube can be used to examine your colon directly (sigmoidoscopy or colonoscopy). This is done to check for the earliest forms of colorectal cancer.  Direct examination of the colon should be repeated every 5-10 years until  age 75. However, if early forms of precancerous polyps or small growths are found or if you have a family history or genetic risk for colorectal cancer, you may need to be screened more often. Skin Cancer  Check your skin from head to toe regularly.  Monitor any moles. Be sure to tell your health care provider:  About any new moles or changes in moles, especially if there is a change in a mole's shape or color.  If you have a mole that is larger than the size of a pencil eraser.  If any of your family members has a history of skin cancer, especially at a young age, talk with your health care provider about genetic screening.  Always use sunscreen. Apply sunscreen liberally and repeatedly throughout the day.  Whenever you are outside, protect yourself by wearing long sleeves, pants, a wide-brimmed hat, and sunglasses. What should I know about osteoporosis? Osteoporosis is a condition in which bone destruction happens more quickly than new bone creation. After menopause, you may be at an increased risk for osteoporosis. To help prevent osteoporosis or the bone fractures that can happen because of osteoporosis, the following is recommended:  If you are 19-50 years old, get at least 1,000 mg of calcium and at least 600 mg of vitamin D per day.  If you are older than age 50 but younger than age 70, get at least 1,200 mg of calcium and at least 600 mg of vitamin D per day.  If you are older than age 70, get at least 1,200 mg of calcium and at least 800 mg of vitamin D per day. Smoking and excessive alcohol intake increase the risk of osteoporosis. Eat foods that are rich in calcium and vitamin D, and do weight-bearing exercises several times each week as directed by your health care provider. What should I know about how menopause affects my mental health? Depression may occur at any age, but it is more common as you become older. Common symptoms of depression include:  Low or sad  mood.  Changes in sleep patterns.  Changes in appetite or eating patterns.  Feeling an overall lack of motivation or enjoyment of activities that you previously enjoyed.  Frequent crying spells. Talk with your health care provider if you think that you are experiencing depression. What should I know about immunizations? It is important that you get and maintain your immunizations. These include:  Tetanus, diphtheria, and pertussis (Tdap) booster vaccine.  Influenza every year before the flu season begins.  Pneumonia vaccine.  Shingles vaccine. Your health care provider may also recommend other immunizations. This information is not intended to replace advice given to you by your health care provider. Make sure you discuss any questions you have with your health care provider. Document Released: 09/26/2005 Document Revised: 02/22/2016 Document Reviewed: 05/08/2015 Elsevier Interactive Patient   Education  2017 [employer]  

## 2016-11-07 LAB — CBC WITH DIFFERENTIAL/PLATELET
BASOS ABS: 0 10*3/uL (ref 0.0–0.2)
Basos: 1 %
EOS (ABSOLUTE): 0.6 10*3/uL — AB (ref 0.0–0.4)
Eos: 10 %
Hematocrit: 37.6 % (ref 34.0–46.6)
Hemoglobin: 12.7 g/dL (ref 11.1–15.9)
Immature Grans (Abs): 0 10*3/uL (ref 0.0–0.1)
Immature Granulocytes: 0 %
LYMPHS ABS: 1.4 10*3/uL (ref 0.7–3.1)
Lymphs: 24 %
MCH: 29 pg (ref 26.6–33.0)
MCHC: 33.8 g/dL (ref 31.5–35.7)
MCV: 86 fL (ref 79–97)
Monocytes Absolute: 0.5 10*3/uL (ref 0.1–0.9)
Monocytes: 9 %
NEUTROS ABS: 3.4 10*3/uL (ref 1.4–7.0)
Neutrophils: 56 %
PLATELETS: 299 10*3/uL (ref 150–379)
RBC: 4.38 x10E6/uL (ref 3.77–5.28)
RDW: 14.1 % (ref 12.3–15.4)
WBC: 5.9 10*3/uL (ref 3.4–10.8)

## 2016-11-07 LAB — CMP14+EGFR
A/G RATIO: 1.7 (ref 1.2–2.2)
ALBUMIN: 4.3 g/dL (ref 3.5–4.8)
ALT: 15 IU/L (ref 0–32)
AST: 19 IU/L (ref 0–40)
Alkaline Phosphatase: 81 IU/L (ref 39–117)
BILIRUBIN TOTAL: 0.9 mg/dL (ref 0.0–1.2)
BUN / CREAT RATIO: 19 (ref 12–28)
BUN: 11 mg/dL (ref 8–27)
CHLORIDE: 100 mmol/L (ref 96–106)
CO2: 23 mmol/L (ref 18–29)
Calcium: 9.1 mg/dL (ref 8.7–10.3)
Creatinine, Ser: 0.59 mg/dL (ref 0.57–1.00)
GFR calc non Af Amer: 91 mL/min/{1.73_m2} (ref >59)
GFR, EST AFRICAN AMERICAN: 105 mL/min/{1.73_m2} (ref >59)
Globulin, Total: 2.6 g/dL (ref 1.5–4.5)
Glucose: 79 mg/dL (ref 65–99)
POTASSIUM: 3.9 mmol/L (ref 3.5–5.2)
Sodium: 140 mmol/L (ref 134–144)
TOTAL PROTEIN: 6.9 g/dL (ref 6.0–8.5)

## 2016-11-07 LAB — LIPID PANEL
CHOLESTEROL TOTAL: 168 mg/dL (ref 100–199)
Chol/HDL Ratio: 2.6 ratio units (ref 0.0–4.4)
HDL: 65 mg/dL (ref >39)
LDL CALC: 80 mg/dL (ref 0–99)
Triglycerides: 114 mg/dL (ref 0–149)
VLDL Cholesterol Cal: 23 mg/dL (ref 5–40)

## 2016-11-07 LAB — TSH: TSH: 2.93 u[IU]/mL (ref 0.450–4.500)

## 2016-11-20 DIAGNOSIS — M9902 Segmental and somatic dysfunction of thoracic region: Secondary | ICD-10-CM | POA: Diagnosis not present

## 2016-11-20 DIAGNOSIS — M9903 Segmental and somatic dysfunction of lumbar region: Secondary | ICD-10-CM | POA: Diagnosis not present

## 2016-11-20 DIAGNOSIS — M9901 Segmental and somatic dysfunction of cervical region: Secondary | ICD-10-CM | POA: Diagnosis not present

## 2016-11-20 DIAGNOSIS — M5137 Other intervertebral disc degeneration, lumbosacral region: Secondary | ICD-10-CM | POA: Diagnosis not present

## 2016-11-24 DIAGNOSIS — M5137 Other intervertebral disc degeneration, lumbosacral region: Secondary | ICD-10-CM | POA: Diagnosis not present

## 2016-11-24 DIAGNOSIS — M9902 Segmental and somatic dysfunction of thoracic region: Secondary | ICD-10-CM | POA: Diagnosis not present

## 2016-11-24 DIAGNOSIS — M9901 Segmental and somatic dysfunction of cervical region: Secondary | ICD-10-CM | POA: Diagnosis not present

## 2016-11-24 DIAGNOSIS — M9903 Segmental and somatic dysfunction of lumbar region: Secondary | ICD-10-CM | POA: Diagnosis not present

## 2016-11-26 DIAGNOSIS — M9902 Segmental and somatic dysfunction of thoracic region: Secondary | ICD-10-CM | POA: Diagnosis not present

## 2016-11-26 DIAGNOSIS — M9901 Segmental and somatic dysfunction of cervical region: Secondary | ICD-10-CM | POA: Diagnosis not present

## 2016-11-26 DIAGNOSIS — M5137 Other intervertebral disc degeneration, lumbosacral region: Secondary | ICD-10-CM | POA: Diagnosis not present

## 2016-11-26 DIAGNOSIS — M9903 Segmental and somatic dysfunction of lumbar region: Secondary | ICD-10-CM | POA: Diagnosis not present

## 2016-12-03 DIAGNOSIS — M9902 Segmental and somatic dysfunction of thoracic region: Secondary | ICD-10-CM | POA: Diagnosis not present

## 2016-12-03 DIAGNOSIS — M5137 Other intervertebral disc degeneration, lumbosacral region: Secondary | ICD-10-CM | POA: Diagnosis not present

## 2016-12-03 DIAGNOSIS — M9901 Segmental and somatic dysfunction of cervical region: Secondary | ICD-10-CM | POA: Diagnosis not present

## 2016-12-03 DIAGNOSIS — M9903 Segmental and somatic dysfunction of lumbar region: Secondary | ICD-10-CM | POA: Diagnosis not present

## 2016-12-04 DIAGNOSIS — M9903 Segmental and somatic dysfunction of lumbar region: Secondary | ICD-10-CM | POA: Diagnosis not present

## 2016-12-04 DIAGNOSIS — M9901 Segmental and somatic dysfunction of cervical region: Secondary | ICD-10-CM | POA: Diagnosis not present

## 2016-12-04 DIAGNOSIS — M5137 Other intervertebral disc degeneration, lumbosacral region: Secondary | ICD-10-CM | POA: Diagnosis not present

## 2016-12-04 DIAGNOSIS — M9902 Segmental and somatic dysfunction of thoracic region: Secondary | ICD-10-CM | POA: Diagnosis not present

## 2016-12-08 DIAGNOSIS — M9901 Segmental and somatic dysfunction of cervical region: Secondary | ICD-10-CM | POA: Diagnosis not present

## 2016-12-08 DIAGNOSIS — M9903 Segmental and somatic dysfunction of lumbar region: Secondary | ICD-10-CM | POA: Diagnosis not present

## 2016-12-08 DIAGNOSIS — M5137 Other intervertebral disc degeneration, lumbosacral region: Secondary | ICD-10-CM | POA: Diagnosis not present

## 2016-12-08 DIAGNOSIS — M9902 Segmental and somatic dysfunction of thoracic region: Secondary | ICD-10-CM | POA: Diagnosis not present

## 2016-12-10 DIAGNOSIS — M9903 Segmental and somatic dysfunction of lumbar region: Secondary | ICD-10-CM | POA: Diagnosis not present

## 2016-12-10 DIAGNOSIS — M5137 Other intervertebral disc degeneration, lumbosacral region: Secondary | ICD-10-CM | POA: Diagnosis not present

## 2016-12-10 DIAGNOSIS — M9902 Segmental and somatic dysfunction of thoracic region: Secondary | ICD-10-CM | POA: Diagnosis not present

## 2016-12-10 DIAGNOSIS — M9901 Segmental and somatic dysfunction of cervical region: Secondary | ICD-10-CM | POA: Diagnosis not present

## 2016-12-15 DIAGNOSIS — M9903 Segmental and somatic dysfunction of lumbar region: Secondary | ICD-10-CM | POA: Diagnosis not present

## 2016-12-15 DIAGNOSIS — M9901 Segmental and somatic dysfunction of cervical region: Secondary | ICD-10-CM | POA: Diagnosis not present

## 2016-12-15 DIAGNOSIS — M9902 Segmental and somatic dysfunction of thoracic region: Secondary | ICD-10-CM | POA: Diagnosis not present

## 2016-12-15 DIAGNOSIS — M5137 Other intervertebral disc degeneration, lumbosacral region: Secondary | ICD-10-CM | POA: Diagnosis not present

## 2016-12-22 DIAGNOSIS — M9903 Segmental and somatic dysfunction of lumbar region: Secondary | ICD-10-CM | POA: Diagnosis not present

## 2016-12-22 DIAGNOSIS — M9902 Segmental and somatic dysfunction of thoracic region: Secondary | ICD-10-CM | POA: Diagnosis not present

## 2016-12-22 DIAGNOSIS — M9904 Segmental and somatic dysfunction of sacral region: Secondary | ICD-10-CM | POA: Diagnosis not present

## 2016-12-22 DIAGNOSIS — M5137 Other intervertebral disc degeneration, lumbosacral region: Secondary | ICD-10-CM | POA: Diagnosis not present

## 2017-01-01 DIAGNOSIS — M9903 Segmental and somatic dysfunction of lumbar region: Secondary | ICD-10-CM | POA: Diagnosis not present

## 2017-01-01 DIAGNOSIS — M9904 Segmental and somatic dysfunction of sacral region: Secondary | ICD-10-CM | POA: Diagnosis not present

## 2017-01-01 DIAGNOSIS — M9902 Segmental and somatic dysfunction of thoracic region: Secondary | ICD-10-CM | POA: Diagnosis not present

## 2017-01-01 DIAGNOSIS — M5137 Other intervertebral disc degeneration, lumbosacral region: Secondary | ICD-10-CM | POA: Diagnosis not present

## 2017-01-07 DIAGNOSIS — M5137 Other intervertebral disc degeneration, lumbosacral region: Secondary | ICD-10-CM | POA: Diagnosis not present

## 2017-01-07 DIAGNOSIS — M9902 Segmental and somatic dysfunction of thoracic region: Secondary | ICD-10-CM | POA: Diagnosis not present

## 2017-01-07 DIAGNOSIS — M9904 Segmental and somatic dysfunction of sacral region: Secondary | ICD-10-CM | POA: Diagnosis not present

## 2017-01-07 DIAGNOSIS — M9903 Segmental and somatic dysfunction of lumbar region: Secondary | ICD-10-CM | POA: Diagnosis not present

## 2017-01-08 DIAGNOSIS — M9904 Segmental and somatic dysfunction of sacral region: Secondary | ICD-10-CM | POA: Diagnosis not present

## 2017-01-08 DIAGNOSIS — M5137 Other intervertebral disc degeneration, lumbosacral region: Secondary | ICD-10-CM | POA: Diagnosis not present

## 2017-01-08 DIAGNOSIS — M9903 Segmental and somatic dysfunction of lumbar region: Secondary | ICD-10-CM | POA: Diagnosis not present

## 2017-01-08 DIAGNOSIS — M9902 Segmental and somatic dysfunction of thoracic region: Secondary | ICD-10-CM | POA: Diagnosis not present

## 2017-01-13 DIAGNOSIS — M9904 Segmental and somatic dysfunction of sacral region: Secondary | ICD-10-CM | POA: Diagnosis not present

## 2017-01-13 DIAGNOSIS — M9902 Segmental and somatic dysfunction of thoracic region: Secondary | ICD-10-CM | POA: Diagnosis not present

## 2017-01-13 DIAGNOSIS — M5137 Other intervertebral disc degeneration, lumbosacral region: Secondary | ICD-10-CM | POA: Diagnosis not present

## 2017-01-13 DIAGNOSIS — M9903 Segmental and somatic dysfunction of lumbar region: Secondary | ICD-10-CM | POA: Diagnosis not present

## 2017-01-15 DIAGNOSIS — M9903 Segmental and somatic dysfunction of lumbar region: Secondary | ICD-10-CM | POA: Diagnosis not present

## 2017-01-15 DIAGNOSIS — M5137 Other intervertebral disc degeneration, lumbosacral region: Secondary | ICD-10-CM | POA: Diagnosis not present

## 2017-01-15 DIAGNOSIS — M9902 Segmental and somatic dysfunction of thoracic region: Secondary | ICD-10-CM | POA: Diagnosis not present

## 2017-01-15 DIAGNOSIS — M9904 Segmental and somatic dysfunction of sacral region: Secondary | ICD-10-CM | POA: Diagnosis not present

## 2017-01-20 DIAGNOSIS — M9904 Segmental and somatic dysfunction of sacral region: Secondary | ICD-10-CM | POA: Diagnosis not present

## 2017-01-20 DIAGNOSIS — M9903 Segmental and somatic dysfunction of lumbar region: Secondary | ICD-10-CM | POA: Diagnosis not present

## 2017-01-20 DIAGNOSIS — M5137 Other intervertebral disc degeneration, lumbosacral region: Secondary | ICD-10-CM | POA: Diagnosis not present

## 2017-01-20 DIAGNOSIS — M9902 Segmental and somatic dysfunction of thoracic region: Secondary | ICD-10-CM | POA: Diagnosis not present

## 2017-01-27 DIAGNOSIS — M9904 Segmental and somatic dysfunction of sacral region: Secondary | ICD-10-CM | POA: Diagnosis not present

## 2017-01-27 DIAGNOSIS — M5137 Other intervertebral disc degeneration, lumbosacral region: Secondary | ICD-10-CM | POA: Diagnosis not present

## 2017-01-27 DIAGNOSIS — M9903 Segmental and somatic dysfunction of lumbar region: Secondary | ICD-10-CM | POA: Diagnosis not present

## 2017-01-27 DIAGNOSIS — M9902 Segmental and somatic dysfunction of thoracic region: Secondary | ICD-10-CM | POA: Diagnosis not present

## 2017-01-29 DIAGNOSIS — M9903 Segmental and somatic dysfunction of lumbar region: Secondary | ICD-10-CM | POA: Diagnosis not present

## 2017-01-29 DIAGNOSIS — M9902 Segmental and somatic dysfunction of thoracic region: Secondary | ICD-10-CM | POA: Diagnosis not present

## 2017-01-29 DIAGNOSIS — M5137 Other intervertebral disc degeneration, lumbosacral region: Secondary | ICD-10-CM | POA: Diagnosis not present

## 2017-01-29 DIAGNOSIS — M9904 Segmental and somatic dysfunction of sacral region: Secondary | ICD-10-CM | POA: Diagnosis not present

## 2017-02-03 DIAGNOSIS — M5137 Other intervertebral disc degeneration, lumbosacral region: Secondary | ICD-10-CM | POA: Diagnosis not present

## 2017-02-03 DIAGNOSIS — M9903 Segmental and somatic dysfunction of lumbar region: Secondary | ICD-10-CM | POA: Diagnosis not present

## 2017-02-03 DIAGNOSIS — M9902 Segmental and somatic dysfunction of thoracic region: Secondary | ICD-10-CM | POA: Diagnosis not present

## 2017-02-03 DIAGNOSIS — M9904 Segmental and somatic dysfunction of sacral region: Secondary | ICD-10-CM | POA: Diagnosis not present

## 2017-02-04 DIAGNOSIS — M5137 Other intervertebral disc degeneration, lumbosacral region: Secondary | ICD-10-CM | POA: Diagnosis not present

## 2017-02-04 DIAGNOSIS — M9903 Segmental and somatic dysfunction of lumbar region: Secondary | ICD-10-CM | POA: Diagnosis not present

## 2017-02-04 DIAGNOSIS — M9902 Segmental and somatic dysfunction of thoracic region: Secondary | ICD-10-CM | POA: Diagnosis not present

## 2017-02-04 DIAGNOSIS — M9904 Segmental and somatic dysfunction of sacral region: Secondary | ICD-10-CM | POA: Diagnosis not present

## 2017-02-09 DIAGNOSIS — M5137 Other intervertebral disc degeneration, lumbosacral region: Secondary | ICD-10-CM | POA: Diagnosis not present

## 2017-02-09 DIAGNOSIS — M9902 Segmental and somatic dysfunction of thoracic region: Secondary | ICD-10-CM | POA: Diagnosis not present

## 2017-02-09 DIAGNOSIS — M9904 Segmental and somatic dysfunction of sacral region: Secondary | ICD-10-CM | POA: Diagnosis not present

## 2017-02-09 DIAGNOSIS — M9903 Segmental and somatic dysfunction of lumbar region: Secondary | ICD-10-CM | POA: Diagnosis not present

## 2017-02-10 ENCOUNTER — Encounter: Payer: Self-pay | Admitting: Gastroenterology

## 2017-02-11 DIAGNOSIS — M9902 Segmental and somatic dysfunction of thoracic region: Secondary | ICD-10-CM | POA: Diagnosis not present

## 2017-02-11 DIAGNOSIS — M9904 Segmental and somatic dysfunction of sacral region: Secondary | ICD-10-CM | POA: Diagnosis not present

## 2017-02-11 DIAGNOSIS — M5137 Other intervertebral disc degeneration, lumbosacral region: Secondary | ICD-10-CM | POA: Diagnosis not present

## 2017-02-11 DIAGNOSIS — M9903 Segmental and somatic dysfunction of lumbar region: Secondary | ICD-10-CM | POA: Diagnosis not present

## 2017-02-17 DIAGNOSIS — M9902 Segmental and somatic dysfunction of thoracic region: Secondary | ICD-10-CM | POA: Diagnosis not present

## 2017-02-17 DIAGNOSIS — M9904 Segmental and somatic dysfunction of sacral region: Secondary | ICD-10-CM | POA: Diagnosis not present

## 2017-02-17 DIAGNOSIS — M5137 Other intervertebral disc degeneration, lumbosacral region: Secondary | ICD-10-CM | POA: Diagnosis not present

## 2017-02-17 DIAGNOSIS — M9903 Segmental and somatic dysfunction of lumbar region: Secondary | ICD-10-CM | POA: Diagnosis not present

## 2017-02-19 DIAGNOSIS — M9904 Segmental and somatic dysfunction of sacral region: Secondary | ICD-10-CM | POA: Diagnosis not present

## 2017-02-19 DIAGNOSIS — M9902 Segmental and somatic dysfunction of thoracic region: Secondary | ICD-10-CM | POA: Diagnosis not present

## 2017-02-19 DIAGNOSIS — M5137 Other intervertebral disc degeneration, lumbosacral region: Secondary | ICD-10-CM | POA: Diagnosis not present

## 2017-02-19 DIAGNOSIS — M9903 Segmental and somatic dysfunction of lumbar region: Secondary | ICD-10-CM | POA: Diagnosis not present

## 2017-02-24 DIAGNOSIS — M9902 Segmental and somatic dysfunction of thoracic region: Secondary | ICD-10-CM | POA: Diagnosis not present

## 2017-02-24 DIAGNOSIS — M9903 Segmental and somatic dysfunction of lumbar region: Secondary | ICD-10-CM | POA: Diagnosis not present

## 2017-02-24 DIAGNOSIS — M9904 Segmental and somatic dysfunction of sacral region: Secondary | ICD-10-CM | POA: Diagnosis not present

## 2017-02-24 DIAGNOSIS — M5137 Other intervertebral disc degeneration, lumbosacral region: Secondary | ICD-10-CM | POA: Diagnosis not present

## 2017-06-26 ENCOUNTER — Encounter: Payer: Self-pay | Admitting: Family

## 2017-06-26 ENCOUNTER — Ambulatory Visit: Payer: PPO | Admitting: Family

## 2017-06-26 VITALS — BP 123/73 | HR 78 | Temp 97.7°F | Ht 64.5 in | Wt 193.8 lb

## 2017-06-26 DIAGNOSIS — H109 Unspecified conjunctivitis: Secondary | ICD-10-CM | POA: Diagnosis not present

## 2017-06-26 MED ORDER — POLYMYXIN B-TRIMETHOPRIM 10000-0.1 UNIT/ML-% OP SOLN
1.0000 [drp] | Freq: Four times a day (QID) | OPHTHALMIC | 0 refills | Status: DC
Start: 2017-06-26 — End: 2017-09-09

## 2017-06-26 NOTE — Patient Instructions (Signed)

## 2017-06-26 NOTE — Progress Notes (Signed)
   Subjective:    Patient ID: Kimberly Davis, female    DOB: 1945-09-24, 71 y.o.   MRN: [id number]  Conjunctivitis   The current episode started 2 days ago. The onset was sudden. The problem occurs continuously. The problem has been unchanged. The problem is moderate. Nothing relieves the symptoms. Associated symptoms include photophobia, ear pain, eye discharge, eye pain and eye redness. Pertinent negatives include no eye itching and no congestion. The eye pain is mild. The right eye is affected.      Review of Systems  HENT: Positive for ear pain. Negative for congestion.   Eyes: Positive for photophobia, pain, discharge and redness. Negative for itching.  All other systems reviewed and are negative.      Objective:   Physical Exam  Constitutional: She is oriented to person, place, and time. She appears well-developed and well-nourished. No distress.  HENT:  Head: Normocephalic and atraumatic.  Mouth/Throat: Oropharynx is clear and moist.  Eyes: Pupils are equal, round, and reactive to light. Right eye exhibits discharge and exudate. Right conjunctiva is injected.  Neck: Normal range of motion. Neck supple. No thyromegaly present.  Cardiovascular: Normal rate, regular rhythm, normal heart sounds and intact distal pulses.  No murmur heard. Pulmonary/Chest: Effort normal and breath sounds normal. No respiratory distress. She has no wheezes.  Abdominal: Soft. Bowel sounds are normal. She exhibits no distension. There is no tenderness.  Musculoskeletal: Normal range of motion. She exhibits no edema or tenderness.  Neurological: She is alert and oriented to person, place, and time.  Skin: Skin is warm and dry.  Psychiatric: She has a normal mood and affect. Her behavior is normal. Judgment and thought content normal.  Vitals reviewed.   BP 123/73   Pulse 78   Temp 97.7 F (36.5 C) (Oral)   Ht 5' 4.5" (1.638 m)   Wt 193 lb 12.8 oz (87.9 kg)   BMI 32.75 kg/m      Assessment &  Plan:  1. Bacterial conjunctivitis of right eye Cool compresses Good hygiene discussed  Do not rub eyes RTO prn or if symptoms do not improve or worsen  - trimethoprim-polymyxin b (POLYTRIM) ophthalmic solution; Place 1 drop every 6 (six) hours into the right eye.  Dispense: 10 mL; Refill: 0   Jannifer Rodneyhristy Ryin Ambrosius, FNP

## 2017-06-30 ENCOUNTER — Encounter: Payer: Self-pay | Admitting: *Deleted

## 2017-06-30 ENCOUNTER — Ambulatory Visit (INDEPENDENT_AMBULATORY_CARE_PROVIDER_SITE_OTHER): Payer: PPO | Admitting: *Deleted

## 2017-06-30 VITALS — BP 126/81 | HR 78 | Ht 62.5 in | Wt 191.0 lb

## 2017-06-30 DIAGNOSIS — Z0000 Encounter for general adult medical examination without abnormal findings: Secondary | ICD-10-CM | POA: Diagnosis not present

## 2017-06-30 DIAGNOSIS — Z1211 Encounter for screening for malignant neoplasm of colon: Secondary | ICD-10-CM

## 2017-06-30 NOTE — Patient Instructions (Addendum)
  Ms. [name] , Thank you for taking time to come for your Medicare Wellness Visit. I appreciate your ongoing commitment to your health goals. Please review the following plan we discussed and let me know if I can assist you in the future.   These are the goals we discussed: Increase activity level. Aim for at least 150 min of physical activity a week.   This is a list of the screening recommended for you and due dates:  Health Maintenance  Topic Date Due  . Tetanus Vaccine  10/29/1962  . Mammogram  05/17/2015  . Stool Blood Test  12/27/2016  . Pneumonia vaccines (2 of 2 - PCV13) 12/28/2016  .  Hepatitis C: One time screening is recommended by Center for Disease Control  (CDC) for  adults born from [birth year] through 1967.   11/12/2017*  . Flu Shot  Completed  . DEXA scan (bone density measurement)  Completed  *Topic was postponed. The date shown is not the original due date.   [facility] [phone] Return your stool specimen card Consider Prevnar vaccine Complete advance directives and return a copy to our office

## 2017-07-01 NOTE — Progress Notes (Signed)
Subjective:   Kimberly Davis is a 71 y.o. female who presents for an Initial Medicare Annual Wellness Visit. Ms Kimberly Davis is married and lives at home with her husband. They have 3 adult children. 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.   Review of Systems    Reports that her health is about the same as last year.   Cardiac Risk Factors include: advanced age (>[id number], 47>65 women);sedentary lifestyle   Other systems negative today.     Objective:    Today's Vitals   07/01/17 0847  BP: 126/81  Pulse: 78  Weight: 191 lb (86.6 kg)  Height: 5' 2.5" (1.588 m)   Body mass index is 34.38 kg/m.   Current Medications (verified) Outpatient Encounter Medications as of 06/30/2017  Medication Sig  . Calcium 600-200 MG-UNIT tablet Take 1 tablet by mouth 2 (two) times daily.  . Cholecalciferol (VITAMIN D-3) 1000 units CAPS Take by mouth.  Marland Kitchen. Cod Liver Oil 1000 MG CAPS Take by mouth.  . docusate sodium (COLACE) 100 MG capsule Take 1 capsule (100 mg total) by mouth 2 (two) times daily.  . Magnesium 250 MG TABS Take 250 mg by mouth daily.  . Multiple Vitamin (MULTIVITAMIN WITH MINERALS) TABS tablet Take 1 tablet by mouth daily.  . pantoprazole (PROTONIX) 40 MG tablet Take 40 mg by mouth as needed.  . trimethoprim-polymyxin b (POLYTRIM) ophthalmic solution Place 1 drop every 6 (six) hours into the right eye.   No facility-administered encounter medications on file as of 06/30/2017.     Allergies (verified) Patient has no known allergies.   History: Past Medical History:  Diagnosis Date  . GERD (gastroesophageal reflux disease)    Past Surgical History:  Procedure Laterality Date  . TUBAL LIGATION     Family History  Problem Relation Age of Onset  . Stroke Mother   . Heart disease Father   . Stroke Sister   . Healthy Daughter   . Healthy Son   . Alzheimer's disease Sister   . COPD Brother   . Stroke Brother   . Healthy Daughter   . Colon cancer  Neg Hx    Social History   Occupational History  . Occupation: Engineer, structuralAdministrative secretary    Comment: [employer]-3/4 time  Tobacco Use  . Smoking status: Never Smoker  . Smokeless tobacco: Never Used  Substance and Sexual Activity  . Alcohol use: No  . Drug use: No  . Sexual activity: Yes    Tobacco Counseling No tobacco use  Activities of Daily Living In your present state of health, do you have any difficulty performing the following activities: 06/30/2017  Hearing? N  Vision? N  Comment eye exam is overdue  Difficulty concentrating or making decisions? N  Walking or climbing stairs? N  Dressing or bathing? N  Doing errands, shopping? N  Preparing Food and eating ? N  Using the Toilet? N  In the past six months, have you accidently leaked urine? N  Do you have problems with loss of bowel control? Y  Comment some stress incontinence  Managing your Medications? N  Managing your Finances? N  Housekeeping or managing your Housekeeping? N  Some recent data might be hidden    Immunizations and Health Maintenance Immunization History  Administered Date(s) Administered  . Influenza, High Dose Seasonal PF 06/16/2017  . Pneumococcal Polysaccharide-23 12/29/2015   Health Maintenance Due  Topic Date Due  . TETANUS/TDAP  10/29/1962  . MAMMOGRAM  05/17/2015  .  COLON CANCER SCREENING ANNUAL FOBT  12/27/2016  . PNA vac Low Risk Adult (2 of 2 - PCV13) 12/28/2016    Patient Care Team: Caryl NeverJones, Angel S, PA-C as PCP - General (Physician Assistant) West BaliFields, Sandi L, MD as Consulting Physician (Gastroenterology) Despina AriasLe, Yen. OD as optometrist  No hospitalizations, ER visits, or surgeries this past year.       Assessment:   This is a routine wellness examination for [name].   Hearing/Vision screen No deficits noted during visit. Eye exam is overdue.   Dietary issues and exercise activities discussed: Current Exercise Habits: The patient does not participate in regular  exercise at present, Exercise limited by: None identified  Goals Exercise for at least 150 minutes a week  Depression Screen PHQ 2/9 Scores 06/30/2017 06/26/2017 11/06/2016 09/05/2016  PHQ - 2 Score 0 0 0 0    Fall Risk Fall Risk  06/30/2017 06/26/2017 11/06/2016 09/05/2016  Falls in the past year? No No No No    Cognitive Function:    MMSE - Mini Mental State Exam 07/01/2017  Orientation to time 5  Orientation to Place 5  Registration 3  Attention/ Calculation 5  Recall 3  Language- name 2 objects 2  Language- repeat 1  Language- follow 3 step command 3  Language- read & follow direction 1  Write a sentence 1  Copy design 1  Total score 30         Screening Tests Health Maintenance  Topic Date Due  . TETANUS/TDAP  10/29/1962  . MAMMOGRAM  05/17/2015  . COLON CANCER SCREENING ANNUAL FOBT  12/27/2016  . PNA vac Low Risk Adult (2 of 2 - PCV13) 12/28/2016  . Hepatitis C Screening  11/12/2017 (Originally [birth year])  . INFLUENZA VACCINE  Completed  . DEXA SCAN  Completed   Declined pneumonia vaccine   Plan:  Schedule eye exam Mammogram scheduled for Jan 2019 FOBT given Exercise for at least 150 minutes a week Recommend Prevnar 13  I have personally reviewed and noted the following in the patient's chart:   . Medical and social history . Use of alcohol, tobacco or illicit drugs  . Current medications and supplements . Functional ability and status . Nutritional status . Physical activity . Advanced directives . List of other physicians . Hospitalizations, surgeries, and ER visits in previous 12 months . Vitals . Screenings to include cognitive, depression, and falls . Referrals and appointments  In addition, I have reviewed and discussed with patient certain preventive protocols, quality metrics, and best practice recommendations. A written personalized care plan for preventive services as well as general preventive health recommendations were provided to  patient.    Demetrios LollKristen Aloysius Heinle, RN  07/01/2017   I have reviewed and agree with the above AWV documentation.   Remus LofflerAngel S. Jones PA-C [facility] [address]  [place], [place] [id number] [phone]

## 2017-08-19 DIAGNOSIS — M5137 Other intervertebral disc degeneration, lumbosacral region: Secondary | ICD-10-CM | POA: Diagnosis not present

## 2017-08-19 DIAGNOSIS — M9904 Segmental and somatic dysfunction of sacral region: Secondary | ICD-10-CM | POA: Diagnosis not present

## 2017-08-19 DIAGNOSIS — M9903 Segmental and somatic dysfunction of lumbar region: Secondary | ICD-10-CM | POA: Diagnosis not present

## 2017-08-19 DIAGNOSIS — M9902 Segmental and somatic dysfunction of thoracic region: Secondary | ICD-10-CM | POA: Diagnosis not present

## 2017-08-26 DIAGNOSIS — M9903 Segmental and somatic dysfunction of lumbar region: Secondary | ICD-10-CM | POA: Diagnosis not present

## 2017-08-26 DIAGNOSIS — M9902 Segmental and somatic dysfunction of thoracic region: Secondary | ICD-10-CM | POA: Diagnosis not present

## 2017-08-26 DIAGNOSIS — M9904 Segmental and somatic dysfunction of sacral region: Secondary | ICD-10-CM | POA: Diagnosis not present

## 2017-08-26 DIAGNOSIS — M5137 Other intervertebral disc degeneration, lumbosacral region: Secondary | ICD-10-CM | POA: Diagnosis not present

## 2017-09-09 ENCOUNTER — Ambulatory Visit (INDEPENDENT_AMBULATORY_CARE_PROVIDER_SITE_OTHER): Payer: PPO | Admitting: Pediatrics

## 2017-09-09 ENCOUNTER — Encounter: Payer: Self-pay | Admitting: Pediatrics

## 2017-09-09 VITALS — BP 105/71 | HR 107 | Temp 97.8°F | Ht 62.5 in | Wt 185.0 lb

## 2017-09-09 DIAGNOSIS — J0100 Acute maxillary sinusitis, unspecified: Secondary | ICD-10-CM

## 2017-09-09 MED ORDER — AMOXICILLIN-POT CLAVULANATE 875-125 MG PO TABS: 1.0000 | ORAL_TABLET | Freq: Two times a day (BID) | ORAL | 0 refills | Status: DC

## 2017-09-09 NOTE — Patient Instructions (Signed)
Sinus rinses Tylenol as needed

## 2017-09-09 NOTE — Progress Notes (Signed)
  Subjective:   Patient ID: Kimberly Davis, female    DOB: 1946/08/08, 72 y.o.   MRN: [id number] CC: Nasal Congestion and Cough  HPI: Kimberly SalinesKay B Davis is a 72 y.o. female presenting for Nasal Congestion and Cough  Started with URI symptoms 12 days ago, felt like a head cold Now with worsening mucus from sinuses, green, yellow, some blood in it Feeling tired all the time Feeling pressure in her sinuses  Appetite has been down Coughing some No SOB, no CP  Relevant past medical, surgical, family and social history reviewed. Allergies and medications reviewed and updated. Social History   Tobacco Use  Smoking Status Never Smoker  Smokeless Tobacco Never Used   ROS: Per HPI   Objective:    BP 105/71   Pulse (!) 107   Temp 97.8 F (36.6 C) (Oral)   Ht 5' 2.5" (1.588 m)   Wt 185 lb (83.9 kg)   BMI 33.30 kg/m   Wt Readings from Last 3 Encounters:  09/09/17 185 lb (83.9 kg)  07/01/17 191 lb (86.6 kg)  06/26/17 193 lb 12.8 oz (87.9 kg)    Gen: NAD, alert, cooperative with exam, NCAT EYES: EOMI, no conjunctival injection, or no icterus ENT:  TMs pearly gray b/l, OP without erythema, ttp over max sinuses b/l LYMPH: no cervical LAD CV: NRRR, normal S1/S2, no murmur, distal pulses 2+ b/l Resp: CTABL, no wheezes, normal WOB Abd: +BS, soft, NTND Ext: No edema, warm Neuro: Alert and oriented  Assessment & Plan:  [name] was seen today for nasal congestion and cough.  Diagnoses and all orders for this visit:  Acute non-recurrent maxillary sinusitis Sinus rinses regularly Start below -     amoxicillin-clavulanate (AUGMENTIN) 875-125 MG tablet; Take 1 tablet by mouth 2 (two) times daily.   Follow up plan: Return if symptoms worsen or fail to improve. Rex Krasarol Olivia Pavelko, MD [facility]

## 2017-09-23 DIAGNOSIS — M9902 Segmental and somatic dysfunction of thoracic region: Secondary | ICD-10-CM | POA: Diagnosis not present

## 2017-09-23 DIAGNOSIS — M9903 Segmental and somatic dysfunction of lumbar region: Secondary | ICD-10-CM | POA: Diagnosis not present

## 2017-09-23 DIAGNOSIS — M9904 Segmental and somatic dysfunction of sacral region: Secondary | ICD-10-CM | POA: Diagnosis not present

## 2017-09-23 DIAGNOSIS — M5137 Other intervertebral disc degeneration, lumbosacral region: Secondary | ICD-10-CM | POA: Diagnosis not present

## 2017-10-20 ENCOUNTER — Ambulatory Visit (INDEPENDENT_AMBULATORY_CARE_PROVIDER_SITE_OTHER): Payer: PPO | Admitting: Gastroenterology

## 2017-10-20 ENCOUNTER — Encounter: Payer: Self-pay | Admitting: Gastroenterology

## 2017-10-20 VITALS — BP 126/74 | HR 76 | Temp 97.1°F | Ht 63.5 in | Wt 186.2 lb

## 2017-10-20 DIAGNOSIS — K219 Gastro-esophageal reflux disease without esophagitis: Secondary | ICD-10-CM

## 2017-10-20 DIAGNOSIS — Z1211 Encounter for screening for malignant neoplasm of colon: Secondary | ICD-10-CM

## 2017-10-20 MED ORDER — PANTOPRAZOLE SODIUM 40 MG PO TBEC: 40.0000 mg | DELAYED_RELEASE_TABLET | Freq: Every day | ORAL | 11 refills | Status: DC

## 2017-10-20 NOTE — Progress Notes (Signed)
cc'ed to pcp °

## 2017-10-20 NOTE — Progress Notes (Addendum)
REVIEWED-LUQ GURGLING TO BE EXPECTED WITH HIATAL HERNIA. SHE SHOULD TAKE HER PROTONIX DAILY IF SHE IS HAVING HEARTBURN MORE THAN 3-4 TIMES A WEEK.   Primary Care Physician: Remus Loffler, PA-C  Primary Gastroenterologist:  Jonette Eva, MD   Chief Complaint  Patient presents with  . gastric ulcer    f/u.    HPI: Kimberly Davis is a 72 y.o. female here follow-up. Last seen in 09/2016. She has a history of Genella Rife, gastric ulcer (12/2015). Family history significant for two first-degree relatives with colon polyps. She's never had a colonoscopy.  Doing pretty good. She has been taking her protonix prn for few months and recently out altogether. Has more issues at night after laying down. Heartburn. No dysphagia. She notes a gurgling sound in the left upper abdomen when she take a breath. She wonders if it is her hiatal hernia. BMs ok. No melena, brbpr. No abd pain. Appetite is good but gets full easily. Has to eat small meals throughout the day.   She inquires about Cologuard testing. She is afraid of potential complications of a colonoscopy but notes that if Cologuard test were positive that she would definitely pursue a colonoscopy at that point.   Current Outpatient Medications  Medication Sig Dispense Refill  . Calcium 600-200 MG-UNIT tablet Take 1 tablet by mouth 2 (two) times daily.    . Cholecalciferol (VITAMIN D-3) 1000 units CAPS Take by mouth. As needed    . docusate sodium (COLACE) 100 MG capsule Take 1 capsule (100 mg total) by mouth 2 (two) times daily. (Patient taking differently: Take 100 mg by mouth daily as needed. ) 10 capsule 0  . pantoprazole (PROTONIX) 40 MG tablet Take 40 mg by mouth as needed.    Marland Kitchen amoxicillin-clavulanate (AUGMENTIN) 875-125 MG tablet Take 1 tablet by mouth 2 (two) times daily. (Patient not taking: Reported on 10/20/2017) 20 tablet 0  . Cod Liver Oil 1000 MG CAPS Take by mouth.    . Magnesium 250 MG TABS Take 250 mg by mouth daily.    . Multiple  Vitamin (MULTIVITAMIN WITH MINERALS) TABS tablet Take 1 tablet by mouth daily.     No current facility-administered medications for this visit.     Allergies as of 10/20/2017  . (No Known Allergies)    ROS:  General: Negative for anorexia, weight loss, fever, chills, fatigue, weakness. ENT: Negative for hoarseness, difficulty swallowing , nasal congestion. CV: Negative for chest pain, angina, palpitations, dyspnea on exertion, peripheral edema.  Respiratory: Negative for dyspnea at rest, dyspnea on exertion, cough, sputum, wheezing.  GI: See history of present illness. GU:  Negative for dysuria, hematuria, urinary incontinence, urinary frequency, nocturnal urination.  Endo: Negative for unusual weight change.    Physical Examination:   BP 126/74   Pulse 76   Temp (!) 97.1 F (36.2 C) (Oral)   Ht 5' 3.5" (1.613 m)   Wt 186 lb 3.2 oz (84.5 kg)   BMI 32.47 kg/m   General: Well-nourished, well-developed in no acute distress.  Eyes: No icterus. Mouth: Oropharyngeal mucosa moist and pink , no lesions erythema or exudate. Lungs: Clear to auscultation bilaterally.  Heart: Regular rate and rhythm, no murmurs rubs or gallops.  Abdomen: Bowel sounds are normal, nontender, nondistended, no hepatosplenomegaly or masses, no abdominal bruits or hernia , no rebound or guarding.   Extremities: No lower extremity edema. No clubbing or deformities. Neuro: Alert and oriented x 4   Skin: Warm and dry, no  jaundice.   Psych: Alert and cooperative, normal mood and affect.  Labs:  Lab Results  Component Value Date   WBC 5.9 11/06/2016   HGB 12.7 11/06/2016   HCT 37.6 11/06/2016   MCV 86 11/06/2016   PLT 299 11/06/2016   Lab Results  Component Value Date   ALT 15 11/06/2016   AST 19 11/06/2016   ALKPHOS 81 11/06/2016   BILITOT 0.9 11/06/2016   Lab Results  Component Value Date   CREATININE 0.59 11/06/2016   BUN 11 11/06/2016   NA 140 11/06/2016   K 3.9 11/06/2016   CL 100  11/06/2016   CO2 23 11/06/2016    Imaging Studies: No results found.

## 2017-10-20 NOTE — Patient Instructions (Signed)
1. Complete Cologuard testing as discussed.  2. RX for Protonix (pantoprazole) sent to your pharmacy.  3. Return to the office in one year or sooner if needed.

## 2017-10-20 NOTE — Assessment & Plan Note (Signed)
Patient desires Cologuard testing as opposed to colonoscopy. She is willing to pursue colonoscopy if Cologuard is positive.

## 2017-10-20 NOTE — Assessment & Plan Note (Signed)
Doing well but out of protonix. Discussed need for daily use if frequent heartburn otherwise can use prn.   She is concerned about a gurgling sound she ears in upper abd with her breathing. On exam, I could appreciate what she is hearing. Suspect due to large hiatal hernia but to address with Dr. Darrick PennaFields.   Return to the office in one year or call sooner if needed.

## 2017-10-21 DIAGNOSIS — M9903 Segmental and somatic dysfunction of lumbar region: Secondary | ICD-10-CM | POA: Diagnosis not present

## 2017-10-21 DIAGNOSIS — M5137 Other intervertebral disc degeneration, lumbosacral region: Secondary | ICD-10-CM | POA: Diagnosis not present

## 2017-10-21 DIAGNOSIS — M9904 Segmental and somatic dysfunction of sacral region: Secondary | ICD-10-CM | POA: Diagnosis not present

## 2017-10-21 DIAGNOSIS — M9902 Segmental and somatic dysfunction of thoracic region: Secondary | ICD-10-CM | POA: Diagnosis not present

## 2017-10-23 NOTE — Progress Notes (Signed)
Please let patient know that Dr. Darrick PennaFields is not concerned about the sound she hears in the upper abdomen with taking deep breaths. It is from her hiatal hernia.

## 2017-10-27 NOTE — Progress Notes (Signed)
Left message for a return call

## 2017-10-27 NOTE — Progress Notes (Signed)
Called. Many rings and no answer.  

## 2017-10-28 NOTE — Progress Notes (Signed)
Pt is aware.  

## 2017-11-11 ENCOUNTER — Encounter: Payer: Self-pay | Admitting: Family Medicine

## 2017-11-11 ENCOUNTER — Ambulatory Visit (INDEPENDENT_AMBULATORY_CARE_PROVIDER_SITE_OTHER): Payer: PPO | Admitting: Family Medicine

## 2017-11-11 VITALS — BP 128/71 | HR 77 | Temp 97.0°F | Ht 63.5 in | Wt 186.1 lb

## 2017-11-11 DIAGNOSIS — J40 Bronchitis, not specified as acute or chronic: Secondary | ICD-10-CM | POA: Diagnosis not present

## 2017-11-11 DIAGNOSIS — R059 Cough, unspecified: Secondary | ICD-10-CM

## 2017-11-11 DIAGNOSIS — J329 Chronic sinusitis, unspecified: Secondary | ICD-10-CM | POA: Diagnosis not present

## 2017-11-11 DIAGNOSIS — R05 Cough: Secondary | ICD-10-CM

## 2017-11-11 LAB — VERITOR FLU A/B WAIVED
INFLUENZA A: NEGATIVE
Influenza B: NEGATIVE

## 2017-11-11 MED ORDER — BENZONATATE 200 MG PO CAPS: 200.0000 mg | ORAL_CAPSULE | Freq: Three times a day (TID) | ORAL | 0 refills | Status: DC | PRN

## 2017-11-11 MED ORDER — AZITHROMYCIN 250 MG PO TABS: ORAL_TABLET | ORAL | 0 refills | Status: DC

## 2017-11-11 NOTE — Progress Notes (Signed)
Chief Complaint  Patient presents with  . Cough    pt here today c/o cough x 4 days    HPI  Patient presents today for patient presents with dry cough and chest tightness. She has congestion with runny stuffy nose.  Posterior drainage and facial pressure are present and increasing with time.  She has diffuse headache of moderate intensity and subjective fever. Body aches negative Has sapped the energy. Onset 4 days ago.   PMH: Smoking status noted ROS: Per HPI  Objective: BP 128/71   Pulse 77   Temp (!) 97 F (36.1 C) (Oral)   Ht 5' 3.5" (1.613 m)   Wt 186 lb 2 oz (84.4 kg)   BMI 32.45 kg/m  Gen: NAD, alert, cooperative with exam HEENT: NCAT, EOMI, PERRL nasal passages boggy . Max. Sinuses tender CV: RRR, good S1/S2, no murmur Resp: lungs have bronchitic change throughout. Ext: No edema, warm Neuro: Alert and oriented, No gross deficits Influenza a and B test negative Assessment and plan:  1. Sinobronchitis   2. Cough     Meds ordered this encounter  Medications  . benzonatate (TESSALON) 200 MG capsule    Sig: Take 1 capsule (200 mg total) by mouth 3 (three) times daily as needed for cough.    Dispense:  20 capsule    Refill:  0  . azithromycin (ZITHROMAX) 250 MG tablet    Sig: Take as directed.    Dispense:  6 tablet    Refill:  0    Orders Placed This Encounter  Procedures  . Veritor Flu A/B Waived    Order Specific Question:   Source    Answer:   nasal    Follow up as needed.  Mechele ClaudeWarren Shilo Pauwels, MD

## 2017-11-18 DIAGNOSIS — M9903 Segmental and somatic dysfunction of lumbar region: Secondary | ICD-10-CM | POA: Diagnosis not present

## 2017-11-18 DIAGNOSIS — M9904 Segmental and somatic dysfunction of sacral region: Secondary | ICD-10-CM | POA: Diagnosis not present

## 2017-11-18 DIAGNOSIS — M5137 Other intervertebral disc degeneration, lumbosacral region: Secondary | ICD-10-CM | POA: Diagnosis not present

## 2017-11-18 DIAGNOSIS — M9902 Segmental and somatic dysfunction of thoracic region: Secondary | ICD-10-CM | POA: Diagnosis not present

## 2017-11-24 ENCOUNTER — Telehealth: Payer: Self-pay

## 2017-11-24 NOTE — Telephone Encounter (Signed)
Pt called and said she has not received her Cologuard. I have printed from epic and re-faxed. She is aware if she does not hear from them in a couple of weeks to call me.

## 2017-11-24 NOTE — Telephone Encounter (Signed)
noted 

## 2017-12-17 DIAGNOSIS — M9903 Segmental and somatic dysfunction of lumbar region: Secondary | ICD-10-CM | POA: Diagnosis not present

## 2017-12-17 DIAGNOSIS — M5137 Other intervertebral disc degeneration, lumbosacral region: Secondary | ICD-10-CM | POA: Diagnosis not present

## 2017-12-17 DIAGNOSIS — M9904 Segmental and somatic dysfunction of sacral region: Secondary | ICD-10-CM | POA: Diagnosis not present

## 2017-12-17 DIAGNOSIS — M9902 Segmental and somatic dysfunction of thoracic region: Secondary | ICD-10-CM | POA: Diagnosis not present

## 2018-01-14 DIAGNOSIS — M5137 Other intervertebral disc degeneration, lumbosacral region: Secondary | ICD-10-CM | POA: Diagnosis not present

## 2018-01-14 DIAGNOSIS — M9903 Segmental and somatic dysfunction of lumbar region: Secondary | ICD-10-CM | POA: Diagnosis not present

## 2018-01-14 DIAGNOSIS — M9904 Segmental and somatic dysfunction of sacral region: Secondary | ICD-10-CM | POA: Diagnosis not present

## 2018-01-14 DIAGNOSIS — M9902 Segmental and somatic dysfunction of thoracic region: Secondary | ICD-10-CM | POA: Diagnosis not present

## 2018-01-18 ENCOUNTER — Ambulatory Visit (INDEPENDENT_AMBULATORY_CARE_PROVIDER_SITE_OTHER): Payer: PPO | Admitting: Family Medicine

## 2018-01-18 ENCOUNTER — Encounter: Payer: Self-pay | Admitting: Family Medicine

## 2018-01-18 VITALS — BP 138/87 | HR 88 | Temp 97.3°F | Ht 67.0 in | Wt 187.2 lb

## 2018-01-18 DIAGNOSIS — R3129 Other microscopic hematuria: Secondary | ICD-10-CM

## 2018-01-18 DIAGNOSIS — N3090 Cystitis, unspecified without hematuria: Secondary | ICD-10-CM | POA: Diagnosis not present

## 2018-01-18 DIAGNOSIS — R399 Unspecified symptoms and signs involving the genitourinary system: Secondary | ICD-10-CM | POA: Diagnosis not present

## 2018-01-18 LAB — URINALYSIS
Bilirubin, UA: NEGATIVE
GLUCOSE, UA: NEGATIVE
Ketones, UA: NEGATIVE
NITRITE UA: NEGATIVE
PH UA: 6.5 (ref 5.0–7.5)
Protein, UA: NEGATIVE
Specific Gravity, UA: <1.005 — ABNORMAL LOW (ref 1.005–1.030)
UUROB: 0.2 mg/dL (ref 0.2–1.0)

## 2018-01-18 MED ORDER — CIPROFLOXACIN HCL 500 MG PO TABS: 500.0000 mg | ORAL_TABLET | Freq: Two times a day (BID) | ORAL | 0 refills | Status: DC

## 2018-01-18 NOTE — Progress Notes (Signed)
Chief Complaint  Patient presents with  . Urinary Urgency  . Burning with urination    HPI  Patient presents today for burning with urination and frequency for several days. Denies fever . some flank pain. No nausea, vomiting.  Patient started taking cranberry juice and symptoms got better at first but today she forgot to drink the juice and the symptoms came back so she thought she better come in she had not noticed any blood in her urine.   PMH: Smoking status noted ROS: Per HPI  Objective: BP 138/87   Pulse 88   Temp (!) 97.3 F (36.3 C) (Oral)   Ht 5\' 7"  (1.702 m)   Wt 187 lb 3.2 oz (84.9 kg)   BMI 29.32 kg/m  Gen: NAD, alert, cooperative with exam HEENT: NCAT, EOMI, PERRL CV: RRR, good S1/S2, no murmur Resp: CTABL, no wheezes, non-labored Abd: SNTND, BS present, no guarding or organomegaly Ext: No edema, warm Neuro: Alert and oriented, No gross deficits Urinalysis: 3+ leukocytes, 2+ RBCs Assessment and plan:  1. Cystitis   2. UTI symptoms   3. Other microscopic hematuria     Meds ordered this encounter  Medications  . ciprofloxacin (CIPRO) 500 MG tablet    Sig: Take 1 tablet (500 mg total) by mouth 2 (two) times daily.    Dispense:  14 tablet    Refill:  0    Orders Placed This Encounter  Procedures  . Urine Culture  . Urinalysis    Follow up in 1 week for repeat specimen to make sure the blood has cleared. Mechele ClaudeWarren Meziah Blasingame, MD

## 2018-01-20 LAB — URINE CULTURE

## 2018-01-21 LAB — URINE CULTURE

## 2018-01-27 ENCOUNTER — Other Ambulatory Visit: Payer: PPO

## 2018-01-27 ENCOUNTER — Telehealth: Payer: Self-pay | Admitting: Family Medicine

## 2018-01-27 DIAGNOSIS — N390 Urinary tract infection, site not specified: Secondary | ICD-10-CM

## 2018-01-27 NOTE — Telephone Encounter (Signed)
Aware that she was to have urine rechecked in 1 week

## 2018-01-29 LAB — URINE CULTURE

## 2018-01-30 ENCOUNTER — Telehealth: Payer: Self-pay | Admitting: Physician Assistant

## 2018-01-30 ENCOUNTER — Other Ambulatory Visit: Payer: Self-pay | Admitting: Family Medicine

## 2018-01-30 MED ORDER — PHENAZOPYRIDINE HCL 100 MG PO TABS: 100.0000 mg | ORAL_TABLET | Freq: Three times a day (TID) | ORAL | 0 refills | Status: AC | PRN

## 2018-01-30 MED ORDER — CEPHALEXIN 500 MG PO CAPS: 500.0000 mg | ORAL_CAPSULE | Freq: Four times a day (QID) | ORAL | 0 refills | Status: DC

## 2018-01-30 NOTE — Telephone Encounter (Signed)
Patient aware that Dr. Nadine CountsGottschalk will send in antibiotic for E. Coli bacteria.

## 2018-01-30 NOTE — Progress Notes (Signed)
Urine culture with E. coli that is resistant to the ciprofloxacin which was prescribed at previous visit.  It is sensitive to cephalosporins.  Keflex p.o. 4 times daily for the next 7 days prescribed to cover for any possible involvement of the kidneys, given reports of flank pain during that visit.  I have also sent in Pyridium for her to take up to 3 times daily as needed dysuria.  She will return for reevaluation if symptoms are persistent or she develops any other concerning symptoms.  Meds ordered this encounter  Medications  . cephALEXin (KEFLEX) 500 MG capsule    Sig: Take 1 capsule (500 mg total) by mouth 4 (four) times daily for 7 days.    Dispense:  28 capsule    Refill:  0  . phenazopyridine (PYRIDIUM) 100 MG tablet    Sig: Take 1 tablet (100 mg total) by mouth 3 (three) times daily as needed for up to 2 days for pain.    Dispense:  6 tablet    Refill:  0   Janiel Crisostomo M. Nadine CountsGottschalk, DO [facility]

## 2018-02-01 ENCOUNTER — Other Ambulatory Visit: Payer: Self-pay | Admitting: Family Medicine

## 2018-02-11 DIAGNOSIS — M9902 Segmental and somatic dysfunction of thoracic region: Secondary | ICD-10-CM | POA: Diagnosis not present

## 2018-02-11 DIAGNOSIS — M9904 Segmental and somatic dysfunction of sacral region: Secondary | ICD-10-CM | POA: Diagnosis not present

## 2018-02-11 DIAGNOSIS — M9903 Segmental and somatic dysfunction of lumbar region: Secondary | ICD-10-CM | POA: Diagnosis not present

## 2018-02-11 DIAGNOSIS — M5137 Other intervertebral disc degeneration, lumbosacral region: Secondary | ICD-10-CM | POA: Diagnosis not present

## 2018-03-11 DIAGNOSIS — M9903 Segmental and somatic dysfunction of lumbar region: Secondary | ICD-10-CM | POA: Diagnosis not present

## 2018-03-11 DIAGNOSIS — M5137 Other intervertebral disc degeneration, lumbosacral region: Secondary | ICD-10-CM | POA: Diagnosis not present

## 2018-03-11 DIAGNOSIS — M9902 Segmental and somatic dysfunction of thoracic region: Secondary | ICD-10-CM | POA: Diagnosis not present

## 2018-03-11 DIAGNOSIS — M9904 Segmental and somatic dysfunction of sacral region: Secondary | ICD-10-CM | POA: Diagnosis not present

## 2018-04-05 DIAGNOSIS — M9902 Segmental and somatic dysfunction of thoracic region: Secondary | ICD-10-CM | POA: Diagnosis not present

## 2018-04-05 DIAGNOSIS — M9904 Segmental and somatic dysfunction of sacral region: Secondary | ICD-10-CM | POA: Diagnosis not present

## 2018-04-05 DIAGNOSIS — M5137 Other intervertebral disc degeneration, lumbosacral region: Secondary | ICD-10-CM | POA: Diagnosis not present

## 2018-04-05 DIAGNOSIS — M9903 Segmental and somatic dysfunction of lumbar region: Secondary | ICD-10-CM | POA: Diagnosis not present

## 2018-04-08 DIAGNOSIS — M9902 Segmental and somatic dysfunction of thoracic region: Secondary | ICD-10-CM | POA: Diagnosis not present

## 2018-04-08 DIAGNOSIS — M5137 Other intervertebral disc degeneration, lumbosacral region: Secondary | ICD-10-CM | POA: Diagnosis not present

## 2018-04-08 DIAGNOSIS — M9903 Segmental and somatic dysfunction of lumbar region: Secondary | ICD-10-CM | POA: Diagnosis not present

## 2018-04-08 DIAGNOSIS — M9904 Segmental and somatic dysfunction of sacral region: Secondary | ICD-10-CM | POA: Diagnosis not present

## 2018-04-12 DIAGNOSIS — M9903 Segmental and somatic dysfunction of lumbar region: Secondary | ICD-10-CM | POA: Diagnosis not present

## 2018-04-12 DIAGNOSIS — M9902 Segmental and somatic dysfunction of thoracic region: Secondary | ICD-10-CM | POA: Diagnosis not present

## 2018-04-12 DIAGNOSIS — M9904 Segmental and somatic dysfunction of sacral region: Secondary | ICD-10-CM | POA: Diagnosis not present

## 2018-04-12 DIAGNOSIS — M5137 Other intervertebral disc degeneration, lumbosacral region: Secondary | ICD-10-CM | POA: Diagnosis not present

## 2018-04-14 DIAGNOSIS — M9903 Segmental and somatic dysfunction of lumbar region: Secondary | ICD-10-CM | POA: Diagnosis not present

## 2018-04-14 DIAGNOSIS — M9904 Segmental and somatic dysfunction of sacral region: Secondary | ICD-10-CM | POA: Diagnosis not present

## 2018-04-14 DIAGNOSIS — M9902 Segmental and somatic dysfunction of thoracic region: Secondary | ICD-10-CM | POA: Diagnosis not present

## 2018-04-14 DIAGNOSIS — M5137 Other intervertebral disc degeneration, lumbosacral region: Secondary | ICD-10-CM | POA: Diagnosis not present

## 2018-04-26 DIAGNOSIS — M9903 Segmental and somatic dysfunction of lumbar region: Secondary | ICD-10-CM | POA: Diagnosis not present

## 2018-04-26 DIAGNOSIS — M5137 Other intervertebral disc degeneration, lumbosacral region: Secondary | ICD-10-CM | POA: Diagnosis not present

## 2018-04-26 DIAGNOSIS — M9902 Segmental and somatic dysfunction of thoracic region: Secondary | ICD-10-CM | POA: Diagnosis not present

## 2018-04-26 DIAGNOSIS — M9904 Segmental and somatic dysfunction of sacral region: Secondary | ICD-10-CM | POA: Diagnosis not present

## 2018-07-01 ENCOUNTER — Ambulatory Visit: Payer: PPO | Admitting: *Deleted

## 2018-10-04 ENCOUNTER — Ambulatory Visit (INDEPENDENT_AMBULATORY_CARE_PROVIDER_SITE_OTHER): Payer: PPO | Admitting: Family Medicine

## 2018-10-04 ENCOUNTER — Encounter: Payer: Self-pay | Admitting: Family Medicine

## 2018-10-04 DIAGNOSIS — J0110 Acute frontal sinusitis, unspecified: Secondary | ICD-10-CM

## 2018-10-04 DIAGNOSIS — J069 Acute upper respiratory infection, unspecified: Secondary | ICD-10-CM

## 2018-10-04 MED ORDER — HYDROCOD POLST-CPM POLST ER 10-8 MG/5ML PO SUER: 5.0000 mL | Freq: Two times a day (BID) | ORAL | 0 refills | Status: DC | PRN

## 2018-10-04 MED ORDER — AMOXICILLIN-POT CLAVULANATE 875-125 MG PO TABS: 1.0000 | ORAL_TABLET | Freq: Two times a day (BID) | ORAL | 0 refills | Status: AC

## 2018-10-04 MED ORDER — HYDROCOD POLST-CPM POLST ER 10-8 MG/5ML PO SUER: 5.0000 mL | Freq: Two times a day (BID) | ORAL | 0 refills | Status: AC | PRN

## 2018-10-04 NOTE — Patient Instructions (Signed)
Sinusitis, Adult  Sinusitis is inflammation of your sinuses. Sinuses are hollow spaces in the bones around your face. Your sinuses are located:   Around your eyes.   In the middle of your forehead.   Behind your nose.   In your cheekbones.  Mucus normally drains out of your sinuses. When your nasal tissues become inflamed or swollen, mucus can become trapped or blocked. This allows bacteria, viruses, and fungi to grow, which leads to infection. Most infections of the sinuses are caused by a virus.  Sinusitis can develop quickly. It can last for up to 4 weeks (acute) or for more than 12 weeks (chronic). Sinusitis often develops after a cold.  What are the causes?  This condition is caused by anything that creates swelling in the sinuses or stops mucus from draining. This includes:   Allergies.   Asthma.   Infection from bacteria or viruses.   Deformities or blockages in your nose or sinuses.   Abnormal growths in the nose (nasal polyps).   Pollutants, such as chemicals or irritants in the air.   Infection from fungi (rare).  What increases the risk?  You are more likely to develop this condition if you:   Have a weak body defense system (immune system).   Do a lot of swimming or diving.   Overuse nasal sprays.   Smoke.  What are the signs or symptoms?  The main symptoms of this condition are pain and a feeling of pressure around the affected sinuses. Other symptoms include:   Stuffy nose or congestion.   Thick drainage from your nose.   Swelling and warmth over the affected sinuses.   Headache.   Upper toothache.   A cough that may get worse at night.   Extra mucus that collects in the throat or the back of the nose (postnasal drip).   Decreased sense of smell and taste.   Fatigue.   A fever.   Sore throat.   Bad breath.  How is this diagnosed?  This condition is diagnosed based on:   Your symptoms.   Your medical history.   A physical exam.   Tests to find out if your condition is  acute or chronic. This may include:  ? Checking your nose for nasal polyps.  ? Viewing your sinuses using a device that has a light (endoscope).  ? Testing for allergies or bacteria.  ? Imaging tests, such as an MRI or CT scan.  In rare cases, a bone biopsy may be done to rule out more serious types of fungal sinus disease.  How is this treated?  Treatment for sinusitis depends on the cause and whether your condition is chronic or acute.   If caused by a virus, your symptoms should go away on their own within 10 days. You may be given medicines to relieve symptoms. They include:  ? Medicines that shrink swollen nasal passages (topical intranasal decongestants).  ? Medicines that treat allergies (antihistamines).  ? A spray that eases inflammation of the nostrils (topical intranasal corticosteroids).  ? Rinses that help get rid of thick mucus in your nose (nasal saline washes).   If caused by bacteria, your health care provider may recommend waiting to see if your symptoms improve. Most bacterial infections will get better without antibiotic medicine. You may be given antibiotics if you have:  ? A severe infection.  ? A weak immune system.   If caused by narrow nasal passages or nasal polyps, you may need   to have surgery.  Follow these instructions at home:  Medicines   Take, use, or apply over-the-counter and prescription medicines only as told by your health care provider. These may include nasal sprays.   If you were prescribed an antibiotic medicine, take it as told by your health care provider. Do not stop taking the antibiotic even if you start to feel better.  Hydrate and humidify     Drink enough fluid to keep your urine pale yellow. Staying hydrated will help to thin your mucus.   Use a cool mist humidifier to keep the humidity level in your home above 50%.   Inhale steam for 10-15 minutes, 3-4 times a day, or as told by your health care provider. You can do this in the bathroom while a hot shower is  running.   Limit your exposure to cool or dry air.  Rest   Rest as much as possible.   Sleep with your head raised (elevated).   Make sure you get enough sleep each night.  General instructions     Apply a warm, moist washcloth to your face 3-4 times a day or as told by your health care provider. This will help with discomfort.   Wash your hands often with soap and water to reduce your exposure to germs. If soap and water are not available, use hand sanitizer.   Do not smoke. Avoid being around people who are smoking (secondhand smoke).   Keep all follow-up visits as told by your health care provider. This is important.  Contact a health care provider if:   You have a fever.   Your symptoms get worse.   Your symptoms do not improve within 10 days.  Get help right away if:   You have a severe headache.   You have persistent vomiting.   You have severe pain or swelling around your face or eyes.   You have vision problems.   You develop confusion.   Your neck is stiff.   You have trouble breathing.  Summary   Sinusitis is soreness and inflammation of your sinuses. Sinuses are hollow spaces in the bones around your face.   This condition is caused by nasal tissues that become inflamed or swollen. The swelling traps or blocks the flow of mucus. This allows bacteria, viruses, and fungi to grow, which leads to infection.   If you were prescribed an antibiotic medicine, take it as told by your health care provider. Do not stop taking the antibiotic even if you start to feel better.   Keep all follow-up visits as told by your health care provider. This is important.  This information is not intended to replace advice given to you by your health care provider. Make sure you discuss any questions you have with your health care provider.  Document Released: 08/04/2005 Document Revised: 01/04/2018 Document Reviewed: 01/04/2018  [employer]vier Interactive Patient Education  2019 [employer]

## 2018-10-04 NOTE — Progress Notes (Signed)
    Subjective:     Kimberly Davis is a 73 y.o. female who presents for evaluation of symptoms of a URI, possible sinusitis. Symptoms include congestion, facial pain, headache described as pressure, low grade fever, nasal congestion, non productive cough, post nasal drip, productive cough with  white and yellow colored sputum, purulent nasal discharge and sinus pressure. Onset of symptoms was 2 weeks ago, and has been gradually worsening since that time. Treatment to date: cough suppressants and decongestants.  The following portions of the patient's history were reviewed and updated as appropriate: allergies, current medications, past family history, past medical history, past social history, past surgical history and problem list.  Review of Systems Constitutional: positive for chills, fatigue and fevers Eyes: negative Ears, nose, mouth, throat, and face: positive for nasal congestion, sore throat and rhinorrhea, sinus pressure Respiratory: positive for cough and sputum Cardiovascular: negative Musculoskeletal:positive for myalgias Neurological: positive for headaches   Objective:    BP 104/72   Pulse 91   Temp 98.5 F (36.9 C) (Oral)   Ht 5\' 7"  (1.702 m)   Wt 192 lb (87.1 kg)   BMI 30.07 kg/m  General appearance: alert, cooperative and mild distress Head: Normocephalic, without obvious abnormality, atraumatic Eyes: negative Ears: normal TM's and external ear canals both ears Nose: purulent and scant discharge, mild congestion, turbinates red, swollen, mild maxillary sinus tenderness bilateral, moderate frontal sinus tenderness bilateral Throat: abnormal findings: mild oropharyngeal erythema Lungs: clear to auscultation bilaterally Heart: regular rate and rhythm, S1, S2 normal, no murmur, click, rub or gallop Skin: Skin color, texture, turgor normal. No rashes or lesions Neurologic: Grossly normal   Assessment:     Kimberly Davis was seen today for uri.  Diagnoses and all orders for  this visit:  Acute non-recurrent frontal sinusitis Symptomatic care discussed. Due to ongoing symptoms and failed OTC treatments, will treat with Augmentin. Increase fluid intake. Report any new or worsening symptoms.  -     amoxicillin-clavulanate (AUGMENTIN) 875-125 MG tablet; Take 1 tablet by mouth 2 (two) times daily for 10 days.  URI with cough and congestion Symptomatic care discussed. Due to ongoing symptoms and failed OTC treatments, will treat with Augmentin. Increase fluid intake. Report any new or worsening symptoms.  -     chlorpheniramine-HYDROcodone (TUSSIONEX) 10-8 MG/5ML SUER; Take 5 mLs by mouth every 12 (twelve) hours as needed for up to 5 days for cough.     Plan:    Discussed diagnosis and treatment of URI. Suggested symptomatic OTC remedies. Nasal saline spray for congestion. Augmentin per orders. Follow up as needed. Call in 3 days if symptoms aren't resolving.    Return if symptoms worsen or fail to improve.  The above assessment and management plan was discussed with the patient. The patient verbalized understanding of and has agreed to the management plan. Patient is aware to call the clinic if symptoms fail to improve or worsen. Patient is aware when to return to the clinic for a follow-up visit. Patient educated on when it is appropriate to go to the emergency department.   Kari Baars, FNP-C [facility] [address] [place], [place] [address] [phone]

## 2019-04-28 ENCOUNTER — Telehealth: Payer: Self-pay | Admitting: Physician Assistant

## 2019-04-28 ENCOUNTER — Other Ambulatory Visit: Payer: Self-pay | Admitting: Physician Assistant

## 2019-04-28 DIAGNOSIS — Z1231 Encounter for screening mammogram for malignant neoplasm of breast: Secondary | ICD-10-CM

## 2019-04-28 NOTE — Telephone Encounter (Signed)
I have not seen her since 2018, is anyone doing her well checks?

## 2019-04-29 NOTE — Telephone Encounter (Signed)
appt made with ARonnald Ramp on 05/10/19

## 2019-05-10 ENCOUNTER — Ambulatory Visit (INDEPENDENT_AMBULATORY_CARE_PROVIDER_SITE_OTHER): Payer: PPO | Admitting: Physician Assistant

## 2019-05-10 ENCOUNTER — Encounter: Payer: Self-pay | Admitting: Physician Assistant

## 2019-05-10 ENCOUNTER — Other Ambulatory Visit: Payer: Self-pay

## 2019-05-10 VITALS — BP 121/83 | HR 77 | Temp 98.0°F | Ht 67.0 in | Wt 191.2 lb

## 2019-05-10 DIAGNOSIS — Z0000 Encounter for general adult medical examination without abnormal findings: Secondary | ICD-10-CM

## 2019-05-10 DIAGNOSIS — M810 Age-related osteoporosis without current pathological fracture: Secondary | ICD-10-CM

## 2019-05-10 DIAGNOSIS — Z1211 Encounter for screening for malignant neoplasm of colon: Secondary | ICD-10-CM | POA: Diagnosis not present

## 2019-05-10 DIAGNOSIS — Z23 Encounter for immunization: Secondary | ICD-10-CM

## 2019-05-10 DIAGNOSIS — K219 Gastro-esophageal reflux disease without esophagitis: Secondary | ICD-10-CM | POA: Diagnosis not present

## 2019-05-10 MED ORDER — PANTOPRAZOLE SODIUM 40 MG PO TBEC: 40.0000 mg | DELAYED_RELEASE_TABLET | Freq: Every day | ORAL | 3 refills | Status: DC

## 2019-05-10 NOTE — Patient Instructions (Signed)
Shoulder Range of Motion Exercises Shoulder range of motion (ROM) exercises are done to keep the shoulder moving freely or to increase movement. They are often recommended for people who have shoulder pain or stiffness or who are recovering from a shoulder surgery. Phase 1 exercises When you are able, do this exercise 1-2 times per day for 30-60 seconds in each direction, or as directed by your health care provider. Pendulum exercise To do this exercise while sitting: 1. Sit in a chair or at the edge of your bed with your feet flat on the floor. 2. Let your affected arm hang down in front of you over the edge of the bed or chair. 3. Relax your shoulder, arm, and hand. 4. Rock your body so your arm gently swings in small circles. You can also use your unaffected arm to start the motion. 5. Repeat changing the direction of the circles, swinging your arm left and right, and swinging your arm forward and back. To do this exercise while standing: 1. Stand next to a sturdy chair or table, and hold on to it with your hand on your unaffected side. 2. Bend forward at the waist. 3. Bend your knees slightly. 4. Relax your shoulder, arm, and hand. 5. While keeping your shoulder relaxed, use body motion to swing your arm in small circles. 6. Repeat changing the direction of the circles, swinging your arm left and right, and swinging your arm forward and back. 7. Between exercises, stand up tall and take a short break to relax your lower back.  Phase 2 exercises Do these exercises 1-2 times per day or as told by your health care provider. Hold each stretch for 30 seconds, and repeat 3 times. Do the exercises with one or both arms as instructed by your health care provider. For these exercises, sit at a table with your hand and arm supported by the table. A chair that slides easily or has wheels can be helpful. External rotation 1. Turn your chair so that your affected side is nearest to the table. 2.  Place your forearm on the table to your side. Bend your elbow about 90 at the elbow (right angle) and place your hand palm facing down on the table. Your elbow should be about 6 inches away from your side. 3. Keeping your arm on the table, lean your body forward. Abduction 1. Turn your chair so that your affected side is nearest to the table. 2. Place your forearm and hand on the table so that your thumb points toward the ceiling and your arm is straight out to your side. 3. Slide your hand out to the side and away from you, using your unaffected arm to do the work. 4. To increase the stretch, you can slide your chair away from the table. Flexion: forward stretch 1. Sit facing the table. Place your hand and elbow on the table in front of you. 2. Slide your hand forward and away from you, using your unaffected arm to do the work. 3. To increase the stretch, you can slide your chair backward. Phase 3 exercises Do these exercises 1-2 times per day or as told by your health care provider. Hold each stretch for 30 seconds, and repeat 3 times. Do the exercises with one or both arms as instructed by your health care provider. Cross-body stretch: posterior capsule stretch 1. Lift your arm straight out in front of you. 2. Bend your arm 90 at the elbow (right angle) so your forearm   moves across your body. 3. Use your other arm to gently pull the elbow across your body, toward your other shoulder. Wall climbs 1. Stand with your affected arm extended out to the side with your hand resting on a door frame. 2. Slide your hand slowly up the door frame. 3. To increase the stretch, step through the door frame. Keep your body upright and do not lean. Wand exercises You will need a cane, a piece of PVC pipe, or a sturdy wooden dowel for wand exercises. Flexion To do this exercise while standing: 1. Hold the wand with both of your hands, palms down. 2. Using the other arm to help, lift your arms up and over  your head, if able. 3. Push upward with your other arm to gently increase the stretch. To do this exercise while lying down: 1. Lie on your back with your elbows resting on the floor and the wand in both your hands. Your hands will be palm down, or pointing toward your feet. 2. Lift your hands toward the ceiling, using your unaffected arm to help if needed. 3. Bring your arms overhead as able, using your unaffected arm to help if needed. Internal rotation 1. Stand while holding the wand behind you with both hands. Your unaffected arm should be extended above your head with the arm of the affected side extended behind you at the level of your waist. The wand should be pointing straight up and down as you hold it. 2. Slowly pull the wand up behind your back by straightening the elbow of your unaffected arm and bending the elbow of your affected arm. External rotation 1. Lie on your back with your affected upper arm supported on a small pillow or rolled towel. When you first do this exercise, keep your upper arm close to your body. Over time, bring your arm up to a 90 angle out to the side. 2. Hold the wand across your stomach and with both hands palm up. Your elbow on your affected side should be bent at a 90 angle. 3. Use your unaffected side to help push your forearm away from you and toward the floor. Keep your elbow on your affected side bent at a 90 angle. Contact a health care provider if you have:  New or increasing pain.  New numbness, tingling, weakness, or discoloration in your arm or hand. This information is not intended to replace advice given to you by your health care provider. Make sure you discuss any questions you have with your health care provider. Document Released: 05/03/2003 Document Revised: 09/16/2017 Document Reviewed: 09/16/2017 Elsevier Patient Education  2020 Elsevier Inc.  

## 2019-05-11 ENCOUNTER — Telehealth: Payer: Self-pay | Admitting: Physician Assistant

## 2019-05-11 LAB — CBC WITH DIFFERENTIAL/PLATELET
Basophils Absolute: 0.1 10*3/uL (ref 0.0–0.2)
Basos: 1 %
EOS (ABSOLUTE): 0.3 10*3/uL (ref 0.0–0.4)
Eos: 5 %
Hematocrit: 39.2 % (ref 34.0–46.6)
Hemoglobin: 13 g/dL (ref 11.1–15.9)
Immature Grans (Abs): 0 10*3/uL (ref 0.0–0.1)
Immature Granulocytes: 0 %
Lymphocytes Absolute: 1.6 10*3/uL (ref 0.7–3.1)
Lymphs: 25 %
MCH: 28.7 pg (ref 26.6–33.0)
MCHC: 33.2 g/dL (ref 31.5–35.7)
MCV: 87 fL (ref 79–97)
Monocytes Absolute: 0.6 10*3/uL (ref 0.1–0.9)
Monocytes: 10 %
Neutrophils Absolute: 3.6 10*3/uL (ref 1.4–7.0)
Neutrophils: 59 %
Platelets: 269 10*3/uL (ref 150–450)
RBC: 4.53 x10E6/uL (ref 3.77–5.28)
RDW: 12.7 % (ref 11.7–15.4)
WBC: 6.3 10*3/uL (ref 3.4–10.8)

## 2019-05-11 LAB — CMP14+EGFR
ALT: 19 IU/L (ref 0–32)
AST: 19 IU/L (ref 0–40)
Albumin/Globulin Ratio: 1.9 (ref 1.2–2.2)
Albumin: 4.6 g/dL (ref 3.7–4.7)
Alkaline Phosphatase: 89 IU/L (ref 39–117)
BUN/Creatinine Ratio: 29 — ABNORMAL HIGH (ref 12–28)
BUN: 18 mg/dL (ref 8–27)
Bilirubin Total: 1 mg/dL (ref 0.0–1.2)
CO2: 25 mmol/L (ref 20–29)
Calcium: 9.6 mg/dL (ref 8.7–10.3)
Chloride: 103 mmol/L (ref 96–106)
Creatinine, Ser: 0.63 mg/dL (ref 0.57–1.00)
GFR calc Af Amer: 101 mL/min/{1.73_m2} (ref >59)
GFR calc non Af Amer: 88 mL/min/{1.73_m2} (ref >59)
Globulin, Total: 2.4 g/dL (ref 1.5–4.5)
Glucose: 89 mg/dL (ref 65–99)
Potassium: 4.7 mmol/L (ref 3.5–5.2)
Sodium: 140 mmol/L (ref 134–144)
Total Protein: 7 g/dL (ref 6.0–8.5)

## 2019-05-11 LAB — LIPID PANEL
Chol/HDL Ratio: 2.7 ratio (ref 0.0–4.4)
Cholesterol, Total: 190 mg/dL (ref 100–199)
HDL: 70 mg/dL (ref >39)
LDL Chol Calc (NIH): 100 mg/dL — ABNORMAL HIGH (ref 0–99)
Triglycerides: 111 mg/dL (ref 0–149)
VLDL Cholesterol Cal: 20 mg/dL (ref 5–40)

## 2019-05-12 ENCOUNTER — Encounter: Payer: Self-pay | Admitting: Physician Assistant

## 2019-05-12 DIAGNOSIS — M810 Age-related osteoporosis without current pathological fracture: Secondary | ICD-10-CM | POA: Insufficient documentation

## 2019-05-12 NOTE — Progress Notes (Signed)
BP 121/83   Pulse 77   Temp 98 F (36.7 C) (Temporal)   Ht _0  (1.702 m)   Wt 191 lb 3.2 oz (86.7 kg)   BMI 29.95 kg/m    Subjective:    Patient ID: Kimberly Davis, female    DOB: [birth year], 73 y.o.   MRN: [id number]  HPI: Kimberly Davis is a 73 y.o. female presenting on 05/10/2019 for Medication Refill and Medical Management of Chronic Issues  This patient comes in. for multiple medical problems including history of GERD, osteoporosis, and some wellness screening.  We have discussed that she needs to have some vaccines updated and she will do that.  Also she needs to have a colonoscopy performed.  We will also get a DEXA scan scheduled in the next few weeks.  She is going for mammogram soon and will try to get it at the site where she is having the mammogram performed.  She reports that she is not having any difficulty with GERD.  She has no complaints about her muscles joints or spine that is giving her any severe problem.  Past Medical History:  Diagnosis Date  . GERD (gastroesophageal reflux disease)    Relevant past medical, surgical, family and social history reviewed and updated as indicated. Interim medical history since our last visit reviewed. Allergies and medications reviewed and updated. DATA REVIEWED: CHART IN EPIC  Family History reviewed for pertinent findings.  Review of Systems  Constitutional: Negative.  Negative for activity change, fatigue and fever.  HENT: Negative.   Eyes: Negative.   Respiratory: Negative.  Negative for cough, shortness of breath and wheezing.   Cardiovascular: Negative.  Negative for chest pain, palpitations and leg swelling.  Gastrointestinal: Negative.  Negative for abdominal pain.  Endocrine: Negative.   Genitourinary: Negative.  Negative for dysuria.  Musculoskeletal: Positive for arthralgias.  Skin: Negative.   Neurological: Negative.     Allergies as of 05/10/2019   No Known Allergies     Medication List       Accurate  as of May 10, 2019 11:59 PM. If you have any questions, ask your nurse or doctor.        STOP taking these medications   Fluad 0.5 ML Susy Generic drug: Influenza Vac A&B Surf Ant Adj Stopped by: Terald Sleeper, PA-C     TAKE these medications   Calcium 600-200 MG-UNIT tablet Take 1 tablet by mouth 2 (two) times daily.   pantoprazole 40 MG tablet Commonly known as: PROTONIX Take 1 tablet (40 mg total) by mouth daily before breakfast.   Vitamin D-3 25 MCG (1000 UT) Caps Take by mouth. As needed          Objective:    BP 121/83   Pulse 77   Temp 98 F (36.7 C) (Temporal)   Ht _1  (1.702 m)   Wt 191 lb 3.2 oz (86.7 kg)   BMI 29.95 kg/m   No Known Allergies  Wt Readings from Last 3 Encounters:  05/10/19 191 lb 3.2 oz (86.7 kg)  10/04/18 192 lb (87.1 kg)  01/18/18 187 lb 3.2 oz (84.9 kg)    Physical Exam Constitutional:      General: She is not in acute distress.    Appearance: Normal appearance. She is well-developed.  HENT:     Head: Normocephalic and atraumatic.  Cardiovascular:     Rate and Rhythm: Normal rate.  Pulmonary:     Effort: Pulmonary effort is normal.  Skin:    General: Skin is warm and dry.     Findings: No rash.  Neurological:     Mental Status: She is alert and oriented to person, place, and time.     Deep Tendon Reflexes: Reflexes are normal and symmetric.     Results for orders placed or performed in visit on 05/10/19  CBC with Differential/Platelet  Result Value Ref Range   WBC 6.3 3.4 - 10.8 x10E3/uL   RBC 4.53 3.77 - 5.28 x10E6/uL   Hemoglobin 13.0 11.1 - 15.9 g/dL   Hematocrit 39.2 34.0 - 46.6 %   MCV 87 79 - 97 fL   MCH 28.7 26.6 - 33.0 pg   MCHC 33.2 31.5 - 35.7 g/dL   RDW 12.7 11.7 - 15.4 %   Platelets 269 150 - 450 x10E3/uL   Neutrophils 59 Not Estab. %   Lymphs 25 Not Estab. %   Monocytes 10 Not Estab. %   Eos 5 Not Estab. %   Basos 1 Not Estab. %   Neutrophils Absolute 3.6 1.4 - 7.0 x10E3/uL   Lymphocytes  Absolute 1.6 0.7 - 3.1 x10E3/uL   Monocytes Absolute 0.6 0.1 - 0.9 x10E3/uL   EOS (ABSOLUTE) 0.3 0.0 - 0.4 x10E3/uL   Basophils Absolute 0.1 0.0 - 0.2 x10E3/uL   Immature Granulocytes 0 Not Estab. %   Immature Grans (Abs) 0.0 0.0 - 0.1 x10E3/uL  CMP14+EGFR  Result Value Ref Range   Glucose 89 65 - 99 mg/dL   BUN 18 8 - 27 mg/dL   Creatinine, Ser 0.63 0.57 - 1.00 mg/dL   GFR calc non Af Amer 88 >59 mL/min/1.73   GFR calc Af Amer 101 >59 mL/min/1.73   BUN/Creatinine Ratio 29 (H) 12 - 28   Sodium 140 134 - 144 mmol/L   Potassium 4.7 3.5 - 5.2 mmol/L   Chloride 103 96 - 106 mmol/L   CO2 25 20 - 29 mmol/L   Calcium 9.6 8.7 - 10.3 mg/dL   Total Protein 7.0 6.0 - 8.5 g/dL   Albumin 4.6 3.7 - 4.7 g/dL   Globulin, Total 2.4 1.5 - 4.5 g/dL   Albumin/Globulin Ratio 1.9 1.2 - 2.2   Bilirubin Total 1.0 0.0 - 1.2 mg/dL   Alkaline Phosphatase 89 39 - 117 IU/L   AST 19 0 - 40 IU/L   ALT 19 0 - 32 IU/L  Lipid panel  Result Value Ref Range   Cholesterol, Total 190 100 - 199 mg/dL   Triglycerides 111 0 - 149 mg/dL   HDL 70 >39 mg/dL   VLDL Cholesterol Cal 20 5 - 40 mg/dL   LDL Chol Calc (NIH) 100 (H) 0 - 99 mg/dL   Chol/HDL Ratio 2.7 0.0 - 4.4 ratio      Assessment & Plan:   1. Special screening for malignant neoplasm of colon - Cologuard  2. Need for immunization against influenza - Flu Vaccine QUAD High Dose(Fluad)  3. Well adult exam - CBC with Differential/Platelet - CMP14+EGFR - Lipid panel  4. Age related osteoporosis, unspecified pathological fracture presence - DG Bone Density; Future   Continue all other maintenance medications as listed above.  Follow up plan: Return in about 6 months (around 11/07/2019) for recheck medications.  Educational handout given for [place] PA-C [place] [address]  [place], [place] [address] [phone]   05/12/2019, 2:53 PM

## 2019-05-12 NOTE — Telephone Encounter (Signed)
Aware of lab results  

## 2019-05-12 NOTE — Telephone Encounter (Signed)
Attempted to contact patient - NVM 

## 2019-06-08 ENCOUNTER — Ambulatory Visit
Admission: RE | Admit: 2019-06-08 | Discharge: 2019-06-08 | Disposition: A | Discharge status: Home / Self Care | Payer: PPO | Source: Ambulatory Visit | Attending: Physician Assistant | Admitting: Physician Assistant

## 2019-06-08 ENCOUNTER — Other Ambulatory Visit: Payer: Self-pay

## 2019-06-08 DIAGNOSIS — Z1231 Encounter for screening mammogram for malignant neoplasm of breast: Secondary | ICD-10-CM

## 2019-07-05 ENCOUNTER — Ambulatory Visit
Admission: RE | Admit: 2019-07-05 | Discharge: 2019-07-05 | Disposition: A | Discharge status: Home / Self Care | Payer: PPO | Source: Ambulatory Visit | Attending: Physician Assistant | Admitting: Physician Assistant

## 2019-07-05 ENCOUNTER — Other Ambulatory Visit: Payer: Self-pay

## 2019-07-05 DIAGNOSIS — M810 Age-related osteoporosis without current pathological fracture: Secondary | ICD-10-CM

## 2019-07-05 DIAGNOSIS — M8589 Other specified disorders of bone density and structure, multiple sites: Secondary | ICD-10-CM | POA: Diagnosis not present

## 2019-07-05 DIAGNOSIS — Z780 Asymptomatic menopausal state: Secondary | ICD-10-CM | POA: Diagnosis not present

## 2019-11-09 ENCOUNTER — Encounter: Payer: Self-pay | Admitting: Physician Assistant

## 2019-11-09 ENCOUNTER — Encounter: Payer: Self-pay | Admitting: Family Medicine

## 2020-03-22 DIAGNOSIS — M9903 Segmental and somatic dysfunction of lumbar region: Secondary | ICD-10-CM | POA: Diagnosis not present

## 2020-03-22 DIAGNOSIS — M5137 Other intervertebral disc degeneration, lumbosacral region: Secondary | ICD-10-CM | POA: Diagnosis not present

## 2020-03-22 DIAGNOSIS — M9902 Segmental and somatic dysfunction of thoracic region: Secondary | ICD-10-CM | POA: Diagnosis not present

## 2020-03-22 DIAGNOSIS — M9904 Segmental and somatic dysfunction of sacral region: Secondary | ICD-10-CM | POA: Diagnosis not present

## 2020-04-04 DIAGNOSIS — M9903 Segmental and somatic dysfunction of lumbar region: Secondary | ICD-10-CM | POA: Diagnosis not present

## 2020-04-04 DIAGNOSIS — M5137 Other intervertebral disc degeneration, lumbosacral region: Secondary | ICD-10-CM | POA: Diagnosis not present

## 2020-04-04 DIAGNOSIS — M9904 Segmental and somatic dysfunction of sacral region: Secondary | ICD-10-CM | POA: Diagnosis not present

## 2020-04-04 DIAGNOSIS — M9902 Segmental and somatic dysfunction of thoracic region: Secondary | ICD-10-CM | POA: Diagnosis not present

## 2020-04-18 DIAGNOSIS — M5137 Other intervertebral disc degeneration, lumbosacral region: Secondary | ICD-10-CM | POA: Diagnosis not present

## 2020-04-18 DIAGNOSIS — M9904 Segmental and somatic dysfunction of sacral region: Secondary | ICD-10-CM | POA: Diagnosis not present

## 2020-04-18 DIAGNOSIS — M9902 Segmental and somatic dysfunction of thoracic region: Secondary | ICD-10-CM | POA: Diagnosis not present

## 2020-04-18 DIAGNOSIS — M9903 Segmental and somatic dysfunction of lumbar region: Secondary | ICD-10-CM | POA: Diagnosis not present

## 2020-05-02 DIAGNOSIS — M9903 Segmental and somatic dysfunction of lumbar region: Secondary | ICD-10-CM | POA: Diagnosis not present

## 2020-05-02 DIAGNOSIS — M9904 Segmental and somatic dysfunction of sacral region: Secondary | ICD-10-CM | POA: Diagnosis not present

## 2020-05-02 DIAGNOSIS — M5137 Other intervertebral disc degeneration, lumbosacral region: Secondary | ICD-10-CM | POA: Diagnosis not present

## 2020-05-02 DIAGNOSIS — M9902 Segmental and somatic dysfunction of thoracic region: Secondary | ICD-10-CM | POA: Diagnosis not present

## 2020-05-08 ENCOUNTER — Other Ambulatory Visit: Payer: Self-pay | Admitting: Emergency Medicine

## 2020-05-08 ENCOUNTER — Other Ambulatory Visit: Payer: Self-pay | Admitting: Physician Assistant

## 2020-05-08 ENCOUNTER — Other Ambulatory Visit: Payer: Self-pay | Admitting: Family Medicine

## 2020-05-08 DIAGNOSIS — Z1231 Encounter for screening mammogram for malignant neoplasm of breast: Secondary | ICD-10-CM

## 2020-05-16 DIAGNOSIS — M9904 Segmental and somatic dysfunction of sacral region: Secondary | ICD-10-CM | POA: Diagnosis not present

## 2020-05-16 DIAGNOSIS — M9903 Segmental and somatic dysfunction of lumbar region: Secondary | ICD-10-CM | POA: Diagnosis not present

## 2020-05-16 DIAGNOSIS — M5137 Other intervertebral disc degeneration, lumbosacral region: Secondary | ICD-10-CM | POA: Diagnosis not present

## 2020-05-16 DIAGNOSIS — M9902 Segmental and somatic dysfunction of thoracic region: Secondary | ICD-10-CM | POA: Diagnosis not present

## 2020-06-14 ENCOUNTER — Other Ambulatory Visit: Payer: Self-pay

## 2020-06-14 ENCOUNTER — Ambulatory Visit
Admission: RE | Admit: 2020-06-14 | Discharge: 2020-06-14 | Disposition: A | Discharge status: Home / Self Care | Payer: PPO | Source: Ambulatory Visit | Attending: Family Medicine | Admitting: Family Medicine

## 2020-06-14 DIAGNOSIS — Z1231 Encounter for screening mammogram for malignant neoplasm of breast: Secondary | ICD-10-CM | POA: Diagnosis not present

## 2020-06-18 ENCOUNTER — Other Ambulatory Visit: Payer: Self-pay | Admitting: Family Medicine

## 2020-06-18 ENCOUNTER — Ambulatory Visit
Admission: RE | Admit: 2020-06-18 | Discharge: 2020-06-18 | Disposition: A | Discharge status: Home / Self Care | Payer: PPO | Source: Ambulatory Visit | Attending: Family Medicine | Admitting: Family Medicine

## 2020-06-18 ENCOUNTER — Other Ambulatory Visit: Payer: Self-pay

## 2020-06-18 ENCOUNTER — Telehealth: Payer: Self-pay

## 2020-06-18 DIAGNOSIS — R928 Other abnormal and inconclusive findings on diagnostic imaging of breast: Secondary | ICD-10-CM

## 2020-06-18 DIAGNOSIS — N6324 Unspecified lump in the left breast, lower inner quadrant: Secondary | ICD-10-CM | POA: Diagnosis not present

## 2020-06-18 NOTE — Telephone Encounter (Signed)
Kim [facility].

## 2020-06-18 NOTE — Telephone Encounter (Signed)
Daughter says that pt found out last Friday that she has a mass LT breast. Daughter says that pt found out through [employer]. The [facility] center told pt to call pcp for further questions. Pt next mammo [place] is next Tuesday. Pt does not want to wait till next week and wants the apt moved up to this week. Please call daughter KIM back.

## 2020-06-26 ENCOUNTER — Other Ambulatory Visit: Payer: PPO

## 2020-11-10 IMAGING — MG DIGITAL SCREENING BILAT W/ TOMO W/ CAD
8 series · 8 of 24 positions shown · non-contrast
Comparison: Previous exam(s).

CLINICAL DATA: Screening.

EXAM:
DIGITAL SCREENING BILATERAL MAMMOGRAM WITH TOMO AND CAD

[L MLO synth-2D]
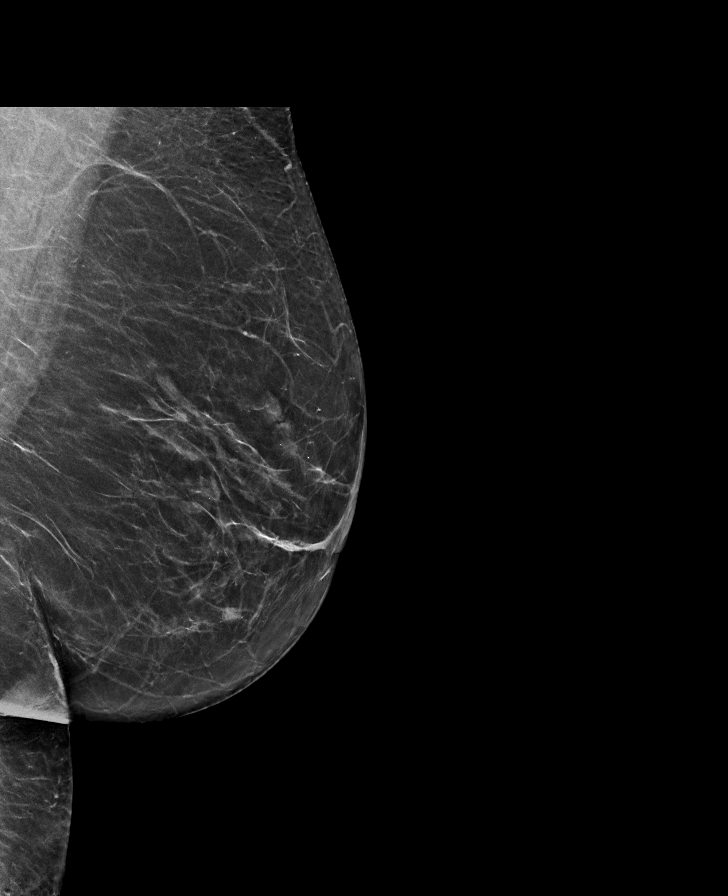

[R MLO synth-2D]
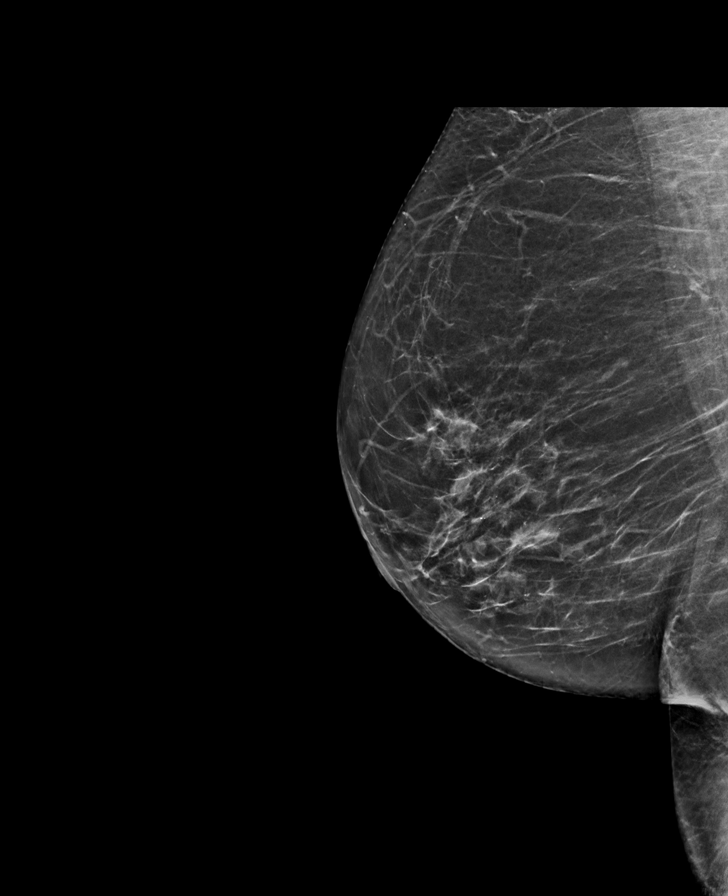

[L CC synth-2D]
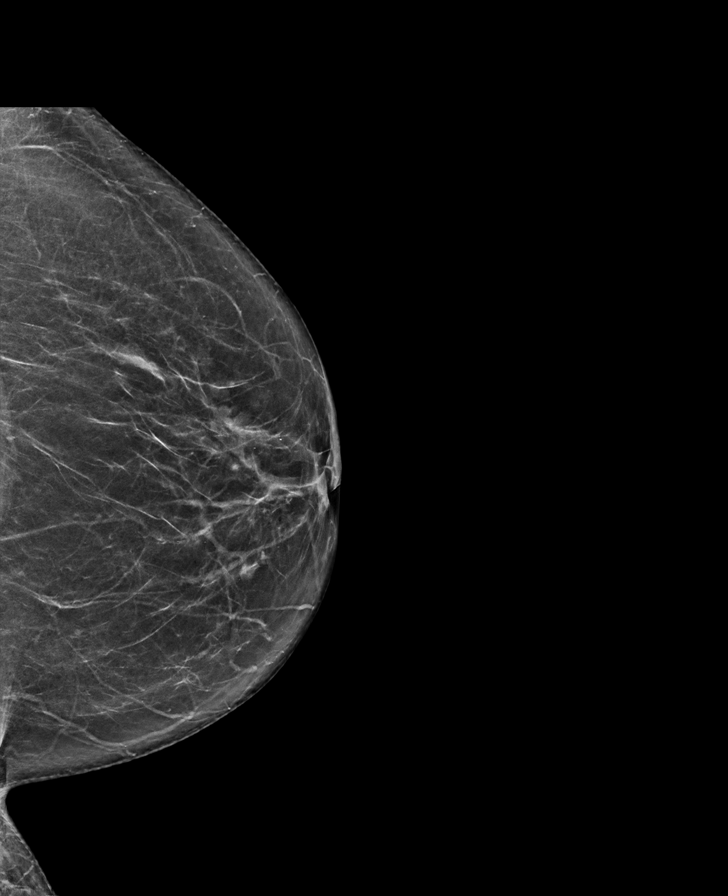

[R CC synth-2D]
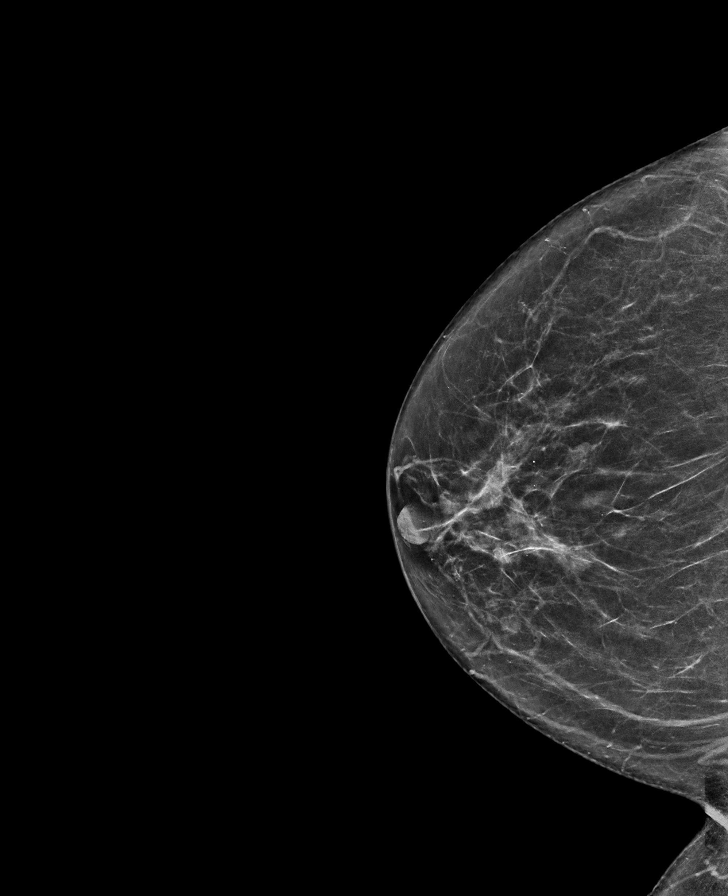

[L MLO tomo · tomo slice 35/69.0]
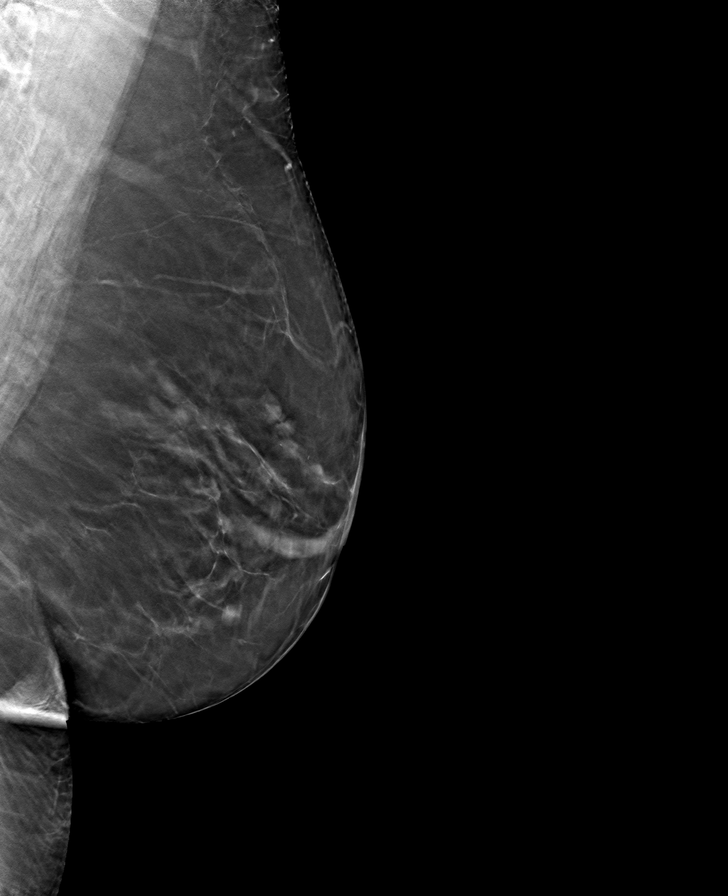

[R CC tomo · tomo slice 35/68.0]
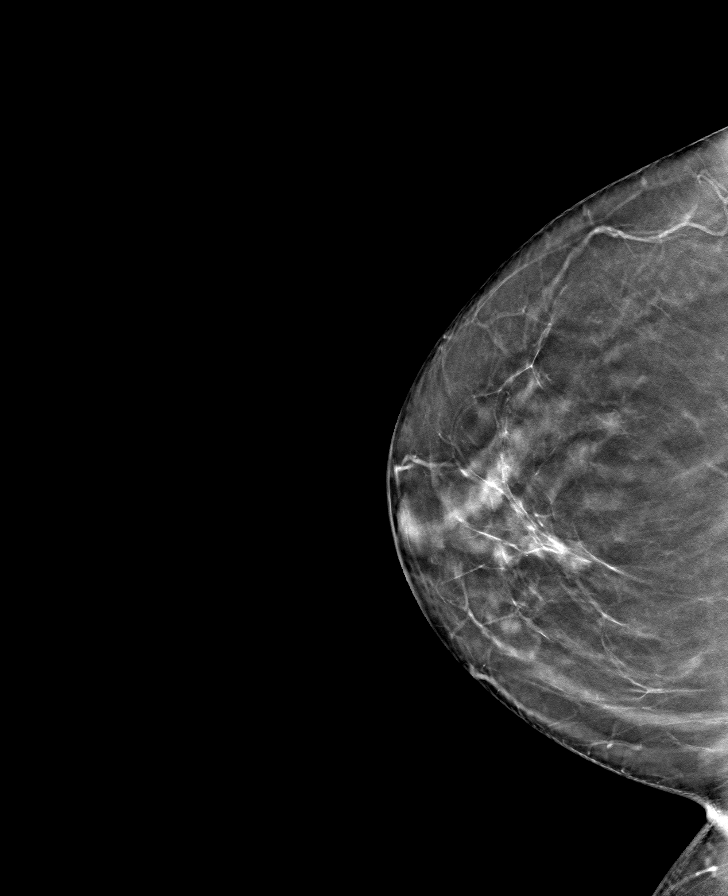

[R MLO tomo · tomo slice 35/68.0]
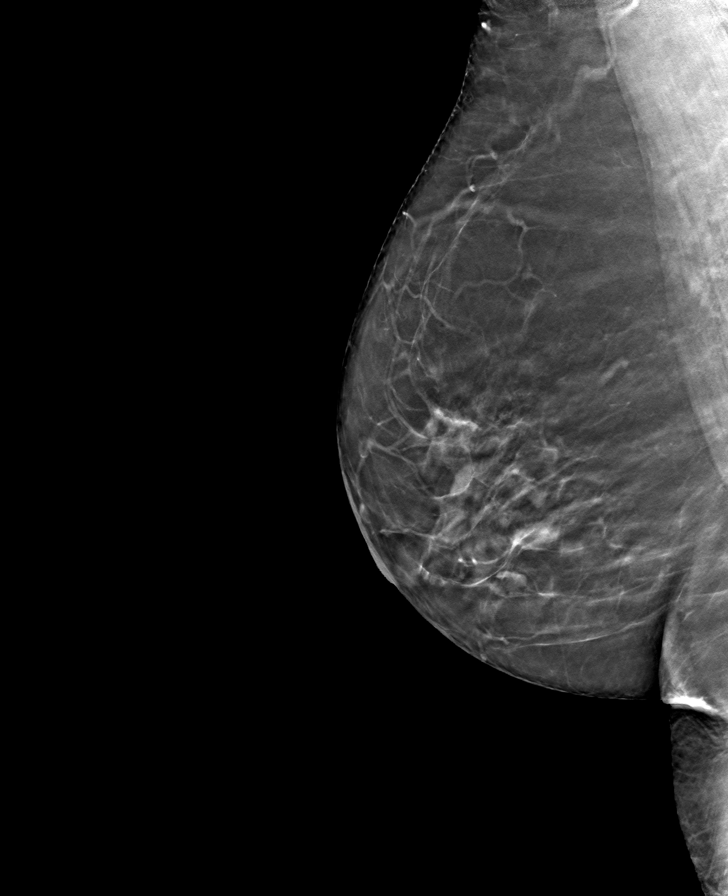

[L CC tomo · tomo slice 35/68.0]
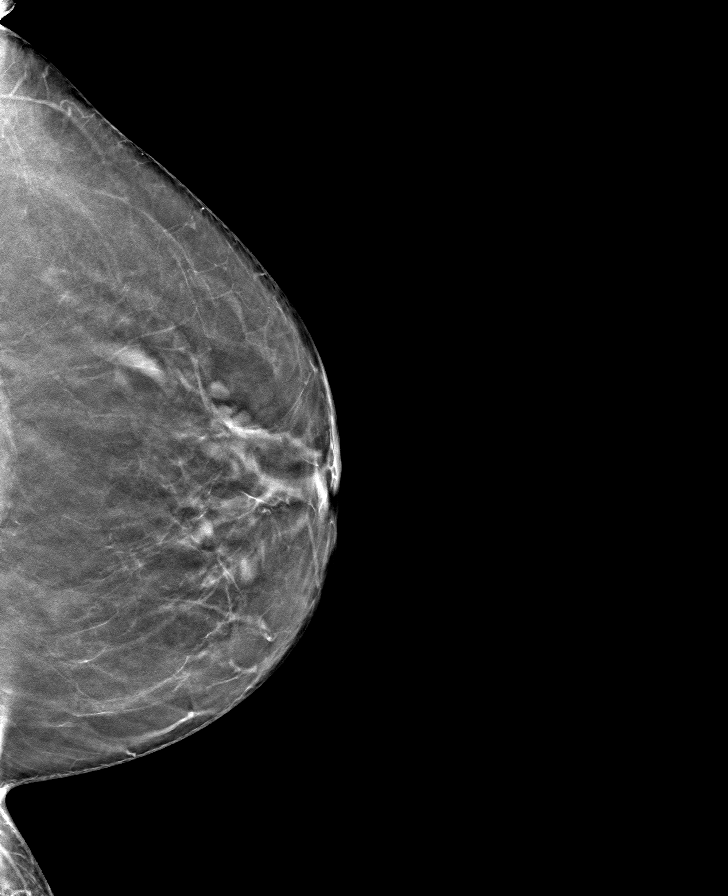

[8 of 24 positions shown; findings below may reference images not displayed]

ACR Breast Density Category b: There are scattered areas of
fibroglandular density.
FINDINGS: In the left breast, a possible mass warrants further evaluation. In
the right breast, no findings suspicious for malignancy. Images were
processed with CAD.
IMPRESSION: Further evaluation is suggested for possible mass in the left
breast.

RECOMMENDATION:
Ultrasound of the left breast. (Code:F4-8-550)

The patient will be contacted regarding the findings, and additional
imaging will be scheduled.

BI-RADS CATEGORY  0: Incomplete. Need additional imaging evaluation
and/or prior mammograms for comparison.

## 2020-11-14 IMAGING — US US BREAST*L* LIMITED INC AXILLA
1 series · 14 of 18 positions shown · non-contrast
Comparison: Previous exam(s).

CLINICAL DATA: Screening recall for a possible small mass in the
left breast.

EXAM:
ULTRASOUND OF THE LEFT BREAST

[Series 1: us breast*left* limited inc axilla · 0.06mm/px · 14 of 18 slices shown]
[im 1/18]
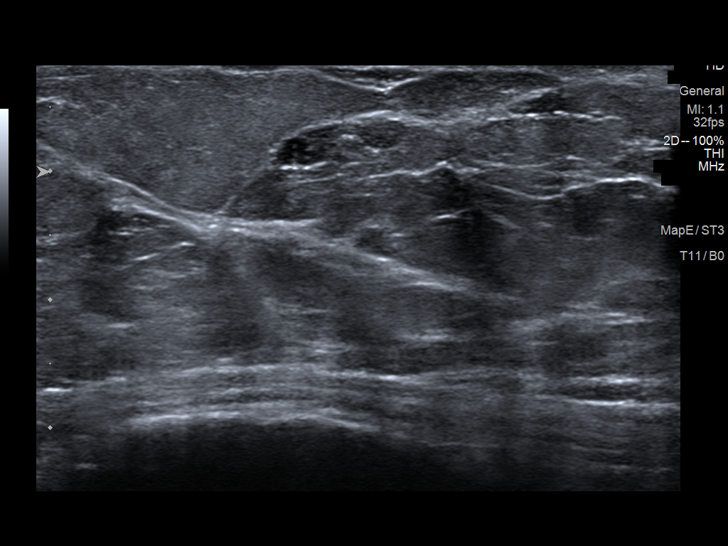
[im 2/18]
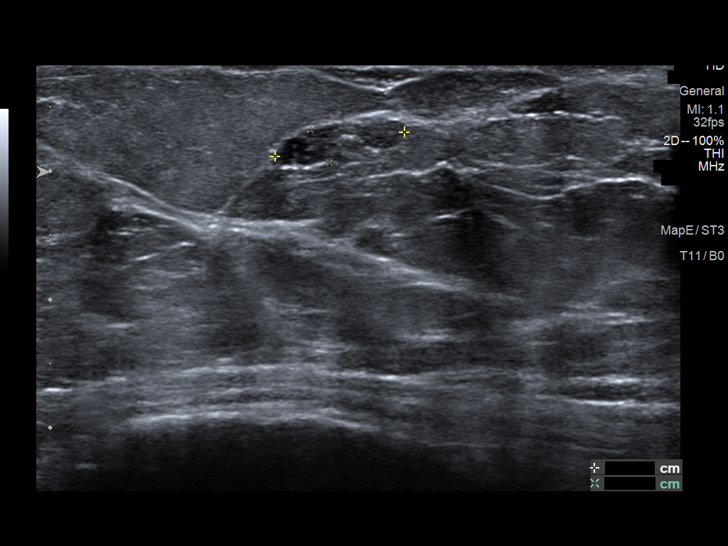
[im 4/18]
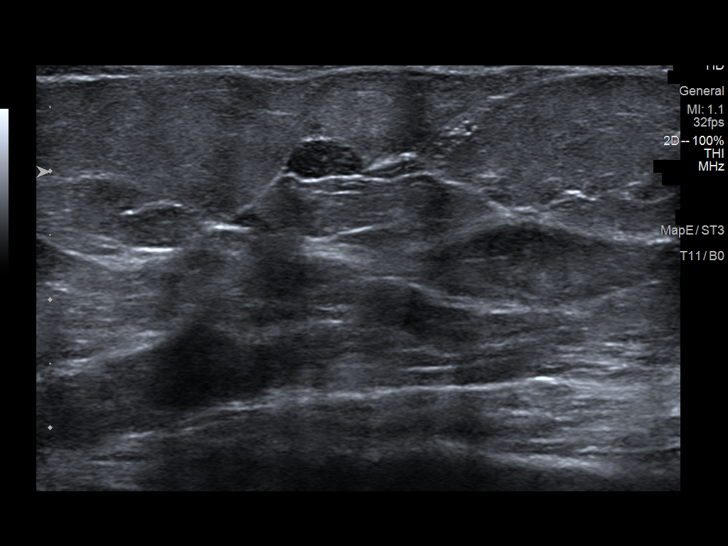
[im 5/18]
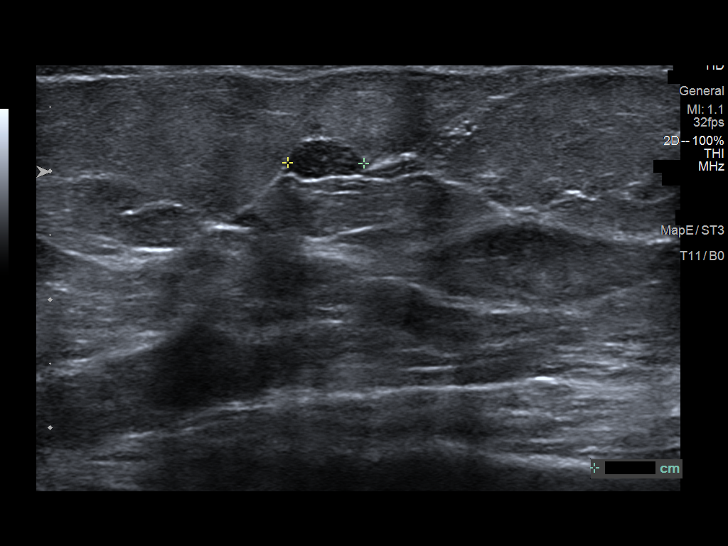
[im 6/18]
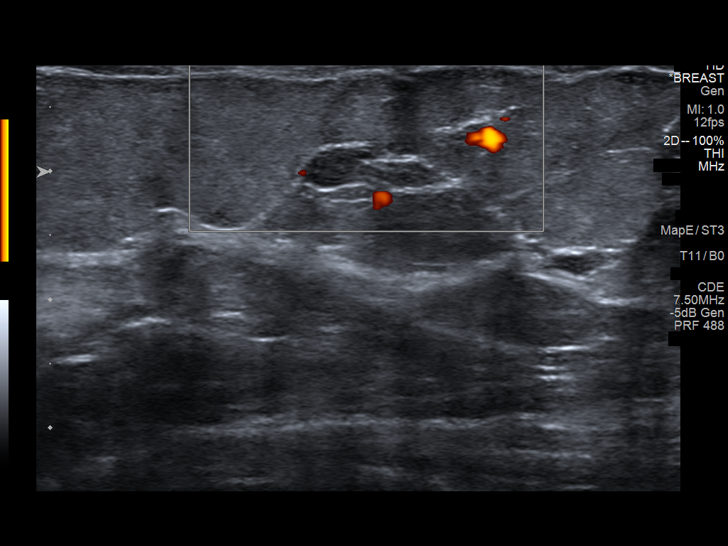
[im 8/18]
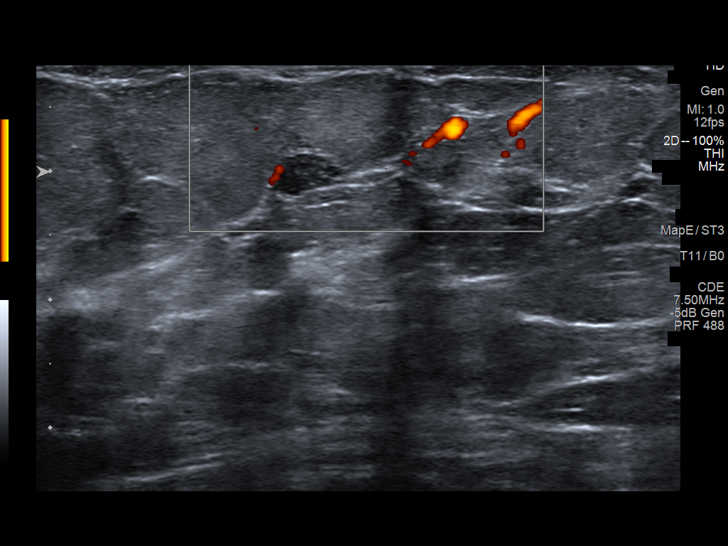
[im 9/18]
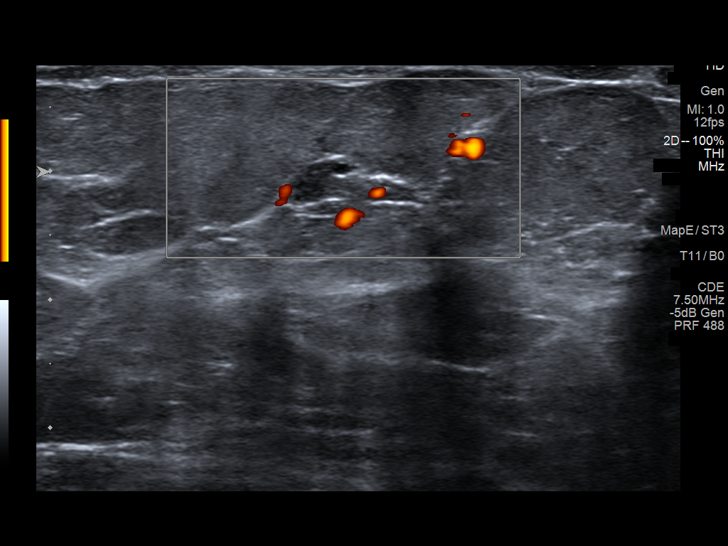
[im 10/18]
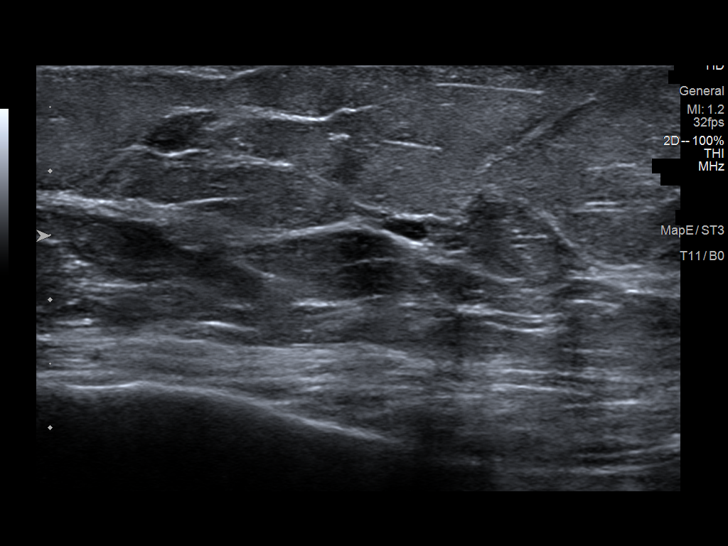
[im 11/18]
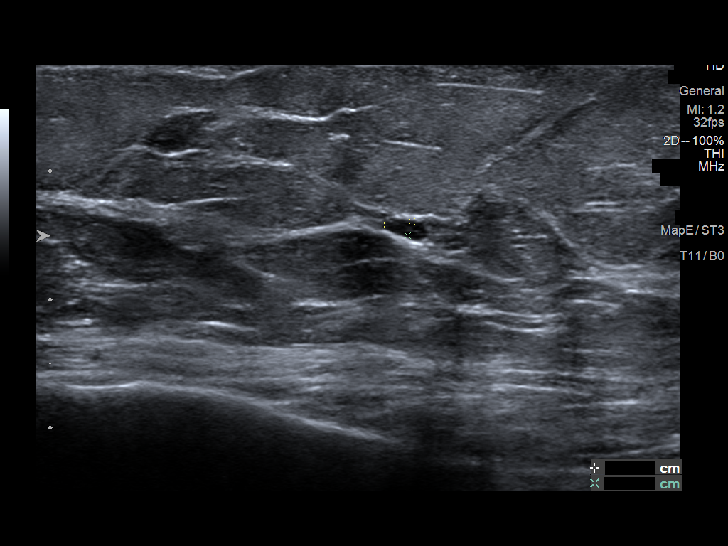
[im 13/18]
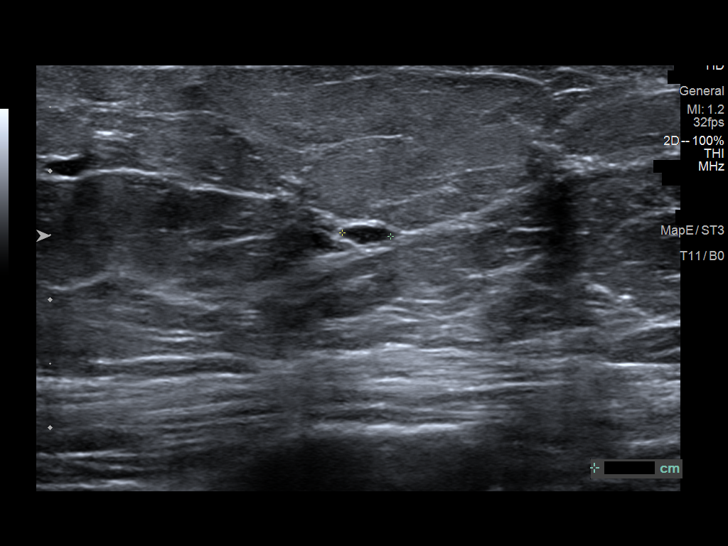
[im 14/18]
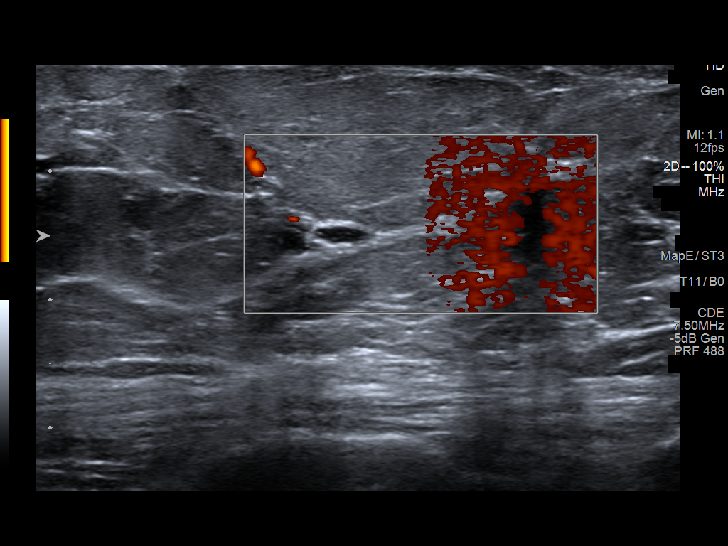
[im 15/18]
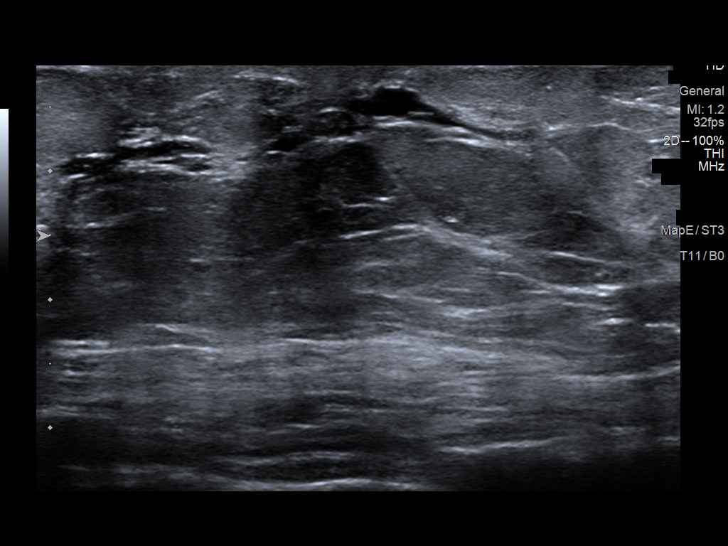
[im 17/18]
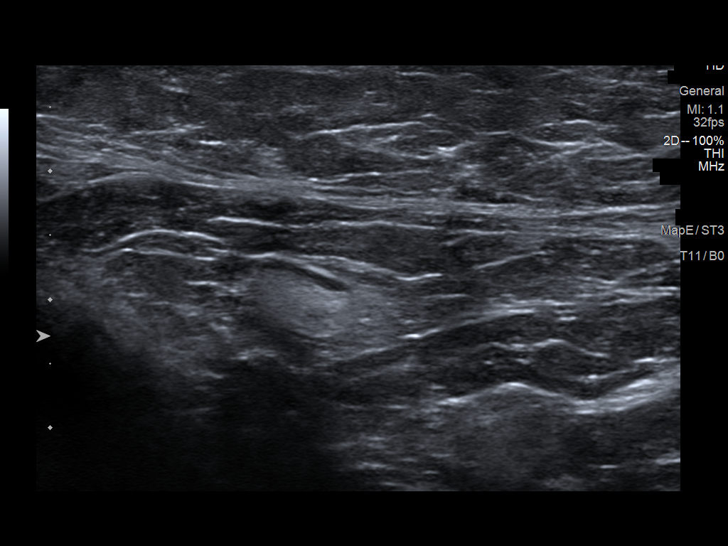
[im 18/18]
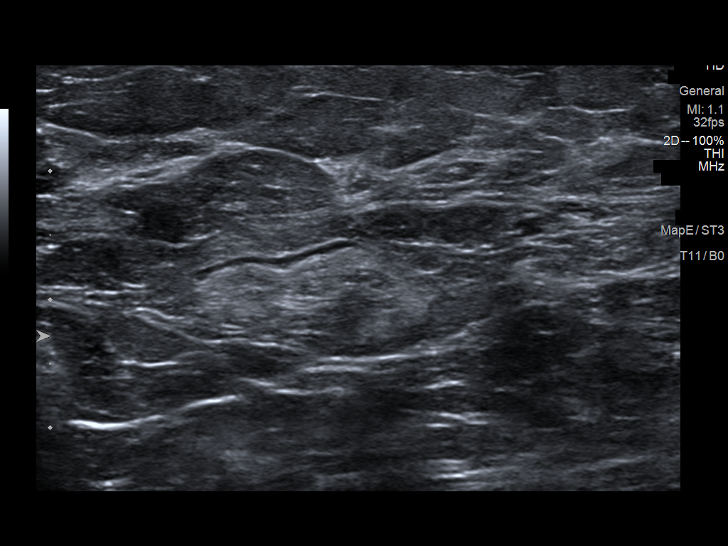

[14 of 18 positions shown; findings below may reference images not displayed]

FINDINGS: Targeted ultrasound is performed, showing a small hypoechoic oval
parallel mass with mostly circumscribed margins in the left breast
at 7:30 o'clock, 3 cm the nipple, measuring 10 x 3 x 6 mm. This
likely correlates to the mammographic mass. It shows no internal
blood flow on color Doppler analysis. There are few adjacent mildly
dilated ducts.
IMPRESSION: 1. Probably benign small mass in the left breast at 7:30 o'clock.
Short-term follow-up recommended.

RECOMMENDATION:
Diagnostic left breast mammography and ultrasound in 6 months.

I have discussed the findings and recommendations with the patient.
If applicable, a reminder letter will be sent to the patient
regarding the next appointment.

BI-RADS CATEGORY  3: Probably benign.

## 2020-12-17 DIAGNOSIS — J329 Chronic sinusitis, unspecified: Secondary | ICD-10-CM | POA: Diagnosis not present

## 2020-12-17 DIAGNOSIS — R059 Cough, unspecified: Secondary | ICD-10-CM | POA: Diagnosis not present

## 2020-12-17 DIAGNOSIS — R519 Headache, unspecified: Secondary | ICD-10-CM | POA: Diagnosis not present

## 2020-12-17 DIAGNOSIS — R0981 Nasal congestion: Secondary | ICD-10-CM | POA: Diagnosis not present

## 2020-12-18 ENCOUNTER — Ambulatory Visit
Admission: RE | Admit: 2020-12-18 | Discharge: 2020-12-18 | Disposition: A | Discharge status: Home / Self Care | Payer: PPO | Source: Ambulatory Visit | Attending: Family Medicine | Admitting: Family Medicine

## 2020-12-18 ENCOUNTER — Other Ambulatory Visit: Payer: PPO

## 2020-12-18 ENCOUNTER — Other Ambulatory Visit: Payer: Self-pay

## 2020-12-18 ENCOUNTER — Other Ambulatory Visit: Payer: Self-pay | Admitting: Family Medicine

## 2020-12-18 DIAGNOSIS — N6324 Unspecified lump in the left breast, lower inner quadrant: Secondary | ICD-10-CM | POA: Diagnosis not present

## 2020-12-18 DIAGNOSIS — R928 Other abnormal and inconclusive findings on diagnostic imaging of breast: Secondary | ICD-10-CM | POA: Diagnosis not present

## 2020-12-19 ENCOUNTER — Other Ambulatory Visit: Payer: PPO

## 2021-01-11 DIAGNOSIS — H2513 Age-related nuclear cataract, bilateral: Secondary | ICD-10-CM | POA: Diagnosis not present

## 2021-01-11 DIAGNOSIS — H40033 Anatomical narrow angle, bilateral: Secondary | ICD-10-CM | POA: Diagnosis not present

## 2021-02-13 ENCOUNTER — Ambulatory Visit
Admission: EM | Admit: 2021-02-13 | Discharge: 2021-02-13 | Disposition: A | Discharge status: Home / Self Care | Payer: PPO | Attending: Family Medicine | Admitting: Family Medicine

## 2021-02-13 ENCOUNTER — Encounter: Payer: Self-pay | Admitting: Emergency Medicine

## 2021-02-13 ENCOUNTER — Other Ambulatory Visit: Payer: Self-pay

## 2021-02-13 DIAGNOSIS — M545 Low back pain, unspecified: Secondary | ICD-10-CM

## 2021-02-13 MED ORDER — KETOROLAC TROMETHAMINE 30 MG/ML IJ SOLN
30.0000 mg | Freq: Once | INTRAMUSCULAR | Status: AC
  Administered 2021-02-13: 19:00:00 30 mg via INTRAMUSCULAR

## 2021-02-13 MED ORDER — TIZANIDINE HCL 2 MG PO TABS: 2.0000 mg | ORAL_TABLET | Freq: Four times a day (QID) | ORAL | 0 refills | Status: AC | PRN

## 2021-02-13 NOTE — ED Triage Notes (Signed)
Slipped and fell against the wall on her buttocks.  C/O pain to RT mid/lower back with movement.

## 2021-02-13 NOTE — Discharge Instructions (Addendum)
You have received a toradol injection for pain in the office today  I have sent in tizanidine for you to take at night as a muscle relaxer. This medication can make you sleepy. Do not drive or operate heavy machinery while taking this medication.  May use topical rubs to the area  May take 800 mg ibuprofen with 1000 mg of Tylenol.  Do not exceed 4000 mg of Tylenol in 24 hours.  Follow up with this office or with primary care if symptoms are persisting.  Follow up in the ER for high fever, trouble swallowing, trouble breathing, other concerning symptoms.

## 2021-02-13 NOTE — ED Provider Notes (Signed)
[facility]   [id number] 02/13/21 Arrival Time: 1811  GQ:QPYPP PAIN  SUBJECTIVE: History from: patient. Kimberly Davis is a 75 y.o. female complains of right low back pain that began this afternoon after falling into the wall and hitting her back on the wall. Reports that pain is at the location of impact. Reports that her husband was falling over and she was trying to catch him. Describes the pain as intermittent and sharp in character with certain movements and with changing positions. Has not tried OTC medications without relief.  Symptoms are made worse with activity. Denies similar symptoms in the past. Denies fever, chills, erythema, ecchymosis, effusion, weakness, numbness and tingling, saddle paresthesias, loss of bowel or bladder function.      ROS: As per HPI.  All other pertinent ROS negative.     Past Medical History:  Diagnosis Date   GERD (gastroesophageal reflux disease)    Past Surgical History:  Procedure Laterality Date   ESOPHAGOGASTRODUODENOSCOPY N/A 12/28/2015   Dr. Darrick Penna: large hiatal hernia, gastric ulcer, gastritis. bx positive for h.pylori    ESOPHAGOGASTRODUODENOSCOPY N/A 04/04/2016   Dr. Darrick Penna: gastritis, hiatal hernia. GU healed. Bx negative for H.pylori   TUBAL LIGATION     No Known Allergies No current facility-administered medications on file prior to encounter.   Current Outpatient Medications on File Prior to Encounter  Medication Sig Dispense Refill   Calcium 600-200 MG-UNIT tablet Take 1 tablet by mouth 2 (two) times daily.     Cholecalciferol (VITAMIN D-3) 1000 units CAPS Take by mouth. As needed     pantoprazole (PROTONIX) 40 MG tablet Take 1 tablet (40 mg total) by mouth daily before breakfast. 90 tablet 3   Social History   Socioeconomic History   Marital status: Married    Spouse name: Not on file   Number of children: 3   Years of education: Not on file   Highest education level: Not on file  Occupational History    Occupation: Engineer, structural    Comment: [employer]-3/4 time  Tobacco Use   Smoking status: Never   Smokeless tobacco: Never  Vaping Use   Vaping Use: Never used  Substance and Sexual Activity   Alcohol use: No   Drug use: No   Sexual activity: Yes  Other Topics Concern   Not on file  Social History Narrative   Not on file   Social Determinants of Health   Financial Resource Strain: Not on file  Food Insecurity: Not on file  Transportation Needs: Not on file  Physical Activity: Not on file  Stress: Not on file  Social Connections: Not on file  Intimate Partner Violence: Not on file   Family History  Problem Relation Age of Onset   Stroke Mother    Heart disease Father    Stroke Sister    Healthy Daughter    Healthy Son    Alzheimer's disease Sister    COPD Brother    Stroke Brother    Healthy Daughter    Colon cancer Neg Hx     OBJECTIVE:  Vitals:   02/13/21 1827  BP: (!) 143/76  Pulse: 79  Resp: 18  Temp: 97.9 F (36.6 C)  TempSrc: Oral  SpO2: 94%    General appearance: ALERT; in no acute distress.  Head: NCAT Lungs: Normal respiratory effort CV: pulses 2+ bilaterally. Cap refill < 2 seconds Musculoskeletal:  Inspection: Skin warm, dry, clear and intact No erythema, effusion noted Palpation: R low back  tender to palpation, muscles in spasm ROM: Limited ROM active and passive to low back with bending, twisting and changing positions Skin: warm and dry Neurologic: Ambulates without difficulty; Sensation intact about the upper/ lower extremities Psychological: alert and cooperative; normal mood and affect  DIAGNOSTIC STUDIES:  No results found.   ASSESSMENT & PLAN:  1. Acute right-sided low back pain without sciatica    Meds ordered this encounter  Medications   tiZANidine (ZANAFLEX) 2 MG tablet    Sig: Take 1 tablet (2 mg total) by mouth every 6 (six) hours as needed for muscle spasms.    Dispense:  30 tablet    Refill:  0     Order Specific Question:   Supervising Provider    Answer:   Merrilee Jansky [id number]   ketorolac (TORADOL) 30 MG/ML injection 30 mg   Toradol 30mg  IM in office today May take 800 mg ibuprofen with 1000 mg of Tylenol.  Do not exceed 4000 mg of Tylenol in 24 hours. Prescribed tizanidine Sedation precautions given Continue conservative management of rest, ice, and gentle stretches Take ibuprofen as needed for pain relief (may cause abdominal discomfort, ulcers, and GI bleeds avoid taking with other NSAIDs) Follow up with PCP if symptoms persist Return or go to the ER if you have any new or worsening symptoms (fever, chills, chest pain, abdominal pain, changes in bowel or bladder habits, pain radiating into lower legs)   Reviewed expectations re: course of current medical issues. Questions answered. Outlined signs and symptoms indicating need for more acute intervention. Patient verbalized understanding. After Visit Summary given.        , NP 02/13/21 1843

## 2021-02-19 DIAGNOSIS — M9902 Segmental and somatic dysfunction of thoracic region: Secondary | ICD-10-CM | POA: Diagnosis not present

## 2021-02-19 DIAGNOSIS — M9903 Segmental and somatic dysfunction of lumbar region: Secondary | ICD-10-CM | POA: Diagnosis not present

## 2021-02-19 DIAGNOSIS — M9904 Segmental and somatic dysfunction of sacral region: Secondary | ICD-10-CM | POA: Diagnosis not present

## 2021-02-19 DIAGNOSIS — M5137 Other intervertebral disc degeneration, lumbosacral region: Secondary | ICD-10-CM | POA: Diagnosis not present

## 2021-02-20 DIAGNOSIS — M9902 Segmental and somatic dysfunction of thoracic region: Secondary | ICD-10-CM | POA: Diagnosis not present

## 2021-02-20 DIAGNOSIS — M5137 Other intervertebral disc degeneration, lumbosacral region: Secondary | ICD-10-CM | POA: Diagnosis not present

## 2021-02-20 DIAGNOSIS — M9904 Segmental and somatic dysfunction of sacral region: Secondary | ICD-10-CM | POA: Diagnosis not present

## 2021-02-20 DIAGNOSIS — M9903 Segmental and somatic dysfunction of lumbar region: Secondary | ICD-10-CM | POA: Diagnosis not present

## 2021-02-21 DIAGNOSIS — M9902 Segmental and somatic dysfunction of thoracic region: Secondary | ICD-10-CM | POA: Diagnosis not present

## 2021-02-21 DIAGNOSIS — M5137 Other intervertebral disc degeneration, lumbosacral region: Secondary | ICD-10-CM | POA: Diagnosis not present

## 2021-02-21 DIAGNOSIS — M9904 Segmental and somatic dysfunction of sacral region: Secondary | ICD-10-CM | POA: Diagnosis not present

## 2021-02-21 DIAGNOSIS — M9903 Segmental and somatic dysfunction of lumbar region: Secondary | ICD-10-CM | POA: Diagnosis not present

## 2021-02-25 DIAGNOSIS — M5137 Other intervertebral disc degeneration, lumbosacral region: Secondary | ICD-10-CM | POA: Diagnosis not present

## 2021-02-25 DIAGNOSIS — M9902 Segmental and somatic dysfunction of thoracic region: Secondary | ICD-10-CM | POA: Diagnosis not present

## 2021-02-25 DIAGNOSIS — M9903 Segmental and somatic dysfunction of lumbar region: Secondary | ICD-10-CM | POA: Diagnosis not present

## 2021-02-25 DIAGNOSIS — M9904 Segmental and somatic dysfunction of sacral region: Secondary | ICD-10-CM | POA: Diagnosis not present

## 2021-02-27 DIAGNOSIS — M9903 Segmental and somatic dysfunction of lumbar region: Secondary | ICD-10-CM | POA: Diagnosis not present

## 2021-02-27 DIAGNOSIS — M9904 Segmental and somatic dysfunction of sacral region: Secondary | ICD-10-CM | POA: Diagnosis not present

## 2021-02-27 DIAGNOSIS — M5137 Other intervertebral disc degeneration, lumbosacral region: Secondary | ICD-10-CM | POA: Diagnosis not present

## 2021-02-27 DIAGNOSIS — M9902 Segmental and somatic dysfunction of thoracic region: Secondary | ICD-10-CM | POA: Diagnosis not present

## 2021-02-28 DIAGNOSIS — M5137 Other intervertebral disc degeneration, lumbosacral region: Secondary | ICD-10-CM | POA: Diagnosis not present

## 2021-02-28 DIAGNOSIS — M9902 Segmental and somatic dysfunction of thoracic region: Secondary | ICD-10-CM | POA: Diagnosis not present

## 2021-02-28 DIAGNOSIS — M9904 Segmental and somatic dysfunction of sacral region: Secondary | ICD-10-CM | POA: Diagnosis not present

## 2021-02-28 DIAGNOSIS — M9903 Segmental and somatic dysfunction of lumbar region: Secondary | ICD-10-CM | POA: Diagnosis not present

## 2021-03-04 DIAGNOSIS — M9903 Segmental and somatic dysfunction of lumbar region: Secondary | ICD-10-CM | POA: Diagnosis not present

## 2021-03-04 DIAGNOSIS — M9904 Segmental and somatic dysfunction of sacral region: Secondary | ICD-10-CM | POA: Diagnosis not present

## 2021-03-04 DIAGNOSIS — M9902 Segmental and somatic dysfunction of thoracic region: Secondary | ICD-10-CM | POA: Diagnosis not present

## 2021-03-04 DIAGNOSIS — M5137 Other intervertebral disc degeneration, lumbosacral region: Secondary | ICD-10-CM | POA: Diagnosis not present

## 2021-03-06 DIAGNOSIS — M5137 Other intervertebral disc degeneration, lumbosacral region: Secondary | ICD-10-CM | POA: Diagnosis not present

## 2021-03-06 DIAGNOSIS — M9902 Segmental and somatic dysfunction of thoracic region: Secondary | ICD-10-CM | POA: Diagnosis not present

## 2021-03-06 DIAGNOSIS — M9904 Segmental and somatic dysfunction of sacral region: Secondary | ICD-10-CM | POA: Diagnosis not present

## 2021-03-06 DIAGNOSIS — M9903 Segmental and somatic dysfunction of lumbar region: Secondary | ICD-10-CM | POA: Diagnosis not present

## 2021-03-07 DIAGNOSIS — M9904 Segmental and somatic dysfunction of sacral region: Secondary | ICD-10-CM | POA: Diagnosis not present

## 2021-03-07 DIAGNOSIS — M9903 Segmental and somatic dysfunction of lumbar region: Secondary | ICD-10-CM | POA: Diagnosis not present

## 2021-03-07 DIAGNOSIS — M9902 Segmental and somatic dysfunction of thoracic region: Secondary | ICD-10-CM | POA: Diagnosis not present

## 2021-03-07 DIAGNOSIS — M5137 Other intervertebral disc degeneration, lumbosacral region: Secondary | ICD-10-CM | POA: Diagnosis not present

## 2021-03-11 DIAGNOSIS — M9904 Segmental and somatic dysfunction of sacral region: Secondary | ICD-10-CM | POA: Diagnosis not present

## 2021-03-11 DIAGNOSIS — M5137 Other intervertebral disc degeneration, lumbosacral region: Secondary | ICD-10-CM | POA: Diagnosis not present

## 2021-03-11 DIAGNOSIS — M9902 Segmental and somatic dysfunction of thoracic region: Secondary | ICD-10-CM | POA: Diagnosis not present

## 2021-03-11 DIAGNOSIS — M9903 Segmental and somatic dysfunction of lumbar region: Secondary | ICD-10-CM | POA: Diagnosis not present

## 2021-03-12 DIAGNOSIS — M9903 Segmental and somatic dysfunction of lumbar region: Secondary | ICD-10-CM | POA: Diagnosis not present

## 2021-03-12 DIAGNOSIS — M5137 Other intervertebral disc degeneration, lumbosacral region: Secondary | ICD-10-CM | POA: Diagnosis not present

## 2021-03-12 DIAGNOSIS — M9904 Segmental and somatic dysfunction of sacral region: Secondary | ICD-10-CM | POA: Diagnosis not present

## 2021-03-12 DIAGNOSIS — M9902 Segmental and somatic dysfunction of thoracic region: Secondary | ICD-10-CM | POA: Diagnosis not present

## 2021-03-13 DIAGNOSIS — M5137 Other intervertebral disc degeneration, lumbosacral region: Secondary | ICD-10-CM | POA: Diagnosis not present

## 2021-03-13 DIAGNOSIS — M9903 Segmental and somatic dysfunction of lumbar region: Secondary | ICD-10-CM | POA: Diagnosis not present

## 2021-03-13 DIAGNOSIS — M9904 Segmental and somatic dysfunction of sacral region: Secondary | ICD-10-CM | POA: Diagnosis not present

## 2021-03-13 DIAGNOSIS — M9902 Segmental and somatic dysfunction of thoracic region: Secondary | ICD-10-CM | POA: Diagnosis not present

## 2021-03-20 DIAGNOSIS — M9902 Segmental and somatic dysfunction of thoracic region: Secondary | ICD-10-CM | POA: Diagnosis not present

## 2021-03-20 DIAGNOSIS — M9903 Segmental and somatic dysfunction of lumbar region: Secondary | ICD-10-CM | POA: Diagnosis not present

## 2021-03-20 DIAGNOSIS — M9904 Segmental and somatic dysfunction of sacral region: Secondary | ICD-10-CM | POA: Diagnosis not present

## 2021-03-20 DIAGNOSIS — M5137 Other intervertebral disc degeneration, lumbosacral region: Secondary | ICD-10-CM | POA: Diagnosis not present

## 2021-03-21 DIAGNOSIS — M9903 Segmental and somatic dysfunction of lumbar region: Secondary | ICD-10-CM | POA: Diagnosis not present

## 2021-03-21 DIAGNOSIS — M5137 Other intervertebral disc degeneration, lumbosacral region: Secondary | ICD-10-CM | POA: Diagnosis not present

## 2021-03-21 DIAGNOSIS — M9904 Segmental and somatic dysfunction of sacral region: Secondary | ICD-10-CM | POA: Diagnosis not present

## 2021-03-21 DIAGNOSIS — M9902 Segmental and somatic dysfunction of thoracic region: Secondary | ICD-10-CM | POA: Diagnosis not present

## 2021-04-04 DIAGNOSIS — M9902 Segmental and somatic dysfunction of thoracic region: Secondary | ICD-10-CM | POA: Diagnosis not present

## 2021-04-04 DIAGNOSIS — M9904 Segmental and somatic dysfunction of sacral region: Secondary | ICD-10-CM | POA: Diagnosis not present

## 2021-04-04 DIAGNOSIS — M9903 Segmental and somatic dysfunction of lumbar region: Secondary | ICD-10-CM | POA: Diagnosis not present

## 2021-04-04 DIAGNOSIS — M5137 Other intervertebral disc degeneration, lumbosacral region: Secondary | ICD-10-CM | POA: Diagnosis not present

## 2021-04-10 DIAGNOSIS — M9902 Segmental and somatic dysfunction of thoracic region: Secondary | ICD-10-CM | POA: Diagnosis not present

## 2021-04-10 DIAGNOSIS — M9903 Segmental and somatic dysfunction of lumbar region: Secondary | ICD-10-CM | POA: Diagnosis not present

## 2021-04-10 DIAGNOSIS — M9904 Segmental and somatic dysfunction of sacral region: Secondary | ICD-10-CM | POA: Diagnosis not present

## 2021-04-10 DIAGNOSIS — M5137 Other intervertebral disc degeneration, lumbosacral region: Secondary | ICD-10-CM | POA: Diagnosis not present

## 2021-04-17 DIAGNOSIS — M5137 Other intervertebral disc degeneration, lumbosacral region: Secondary | ICD-10-CM | POA: Diagnosis not present

## 2021-04-17 DIAGNOSIS — M9903 Segmental and somatic dysfunction of lumbar region: Secondary | ICD-10-CM | POA: Diagnosis not present

## 2021-04-17 DIAGNOSIS — M9902 Segmental and somatic dysfunction of thoracic region: Secondary | ICD-10-CM | POA: Diagnosis not present

## 2021-04-17 DIAGNOSIS — M9904 Segmental and somatic dysfunction of sacral region: Secondary | ICD-10-CM | POA: Diagnosis not present

## 2021-06-20 ENCOUNTER — Ambulatory Visit: Payer: PPO

## 2021-06-21 ENCOUNTER — Ambulatory Visit (INDEPENDENT_AMBULATORY_CARE_PROVIDER_SITE_OTHER): Payer: PPO

## 2021-06-21 VITALS — Ht 67.0 in | Wt 184.0 lb

## 2021-06-21 DIAGNOSIS — Z0000 Encounter for general adult medical examination without abnormal findings: Secondary | ICD-10-CM | POA: Diagnosis not present

## 2021-06-21 NOTE — Patient Instructions (Signed)
Kimberly Davis , Thank you for taking time to come for your Medicare Wellness Visit. I appreciate your ongoing commitment to your health goals. Please review the following plan we discussed and let me know if I can assist you in the future.   Screening recommendations/referrals: Colonoscopy: No longer required Mammogram: Done 12/18/2020 - Repeat plus ultrasound scheduled for 06/27/2021 Bone Density: Done 07/05/2019 - Repeat every 2 years  Recommended yearly ophthalmology/optometry visit for glaucoma screening and checkup Recommended yearly dental visit for hygiene and checkup  Vaccinations: Influenza vaccine: Done 06/19/2021 - Repeat annually  Pneumococcal vaccine: Done 12/29/2015 & 05/10/2019 Tdap vaccine: Due. Every 10 years Shingles vaccine: Due. Discussed   Covid-19: Done 3/22021, 11/15/2019, & 03/23/2021  Advanced directives: Please bring a copy of your health care power of attorney and living will to the office to be added to your chart at your convenience.   Conditions/risks identified: Aim for 30 minutes of exercise or brisk walking each day, drink 6-8 glasses of water and eat lots of fruits and vegetables.   Next appointment: Follow up in one year for your annual wellness visit    Preventive Care 65 Years and Older, Female Preventive care refers to lifestyle choices and visits with your health care provider that can promote health and wellness. What does preventive care include? A yearly physical exam. This is also called an annual well check. Dental exams once or twice a year. Routine eye exams. Ask your health care provider how often you should have your eyes checked. Personal lifestyle choices, including: Daily care of your teeth and gums. Regular physical activity. Eating a healthy diet. Avoiding tobacco and drug use. Limiting alcohol use. Practicing safe sex. Taking low-dose aspirin every day. Taking vitamin and mineral supplements as recommended by your health care  provider. What happens during an annual well check? The services and screenings done by your health care provider during your annual well check will depend on your age, overall health, lifestyle risk factors, and family history of disease. Counseling  Your health care provider may ask you questions about your: Alcohol use. Tobacco use. Drug use. Emotional well-being. Home and relationship well-being. Sexual activity. Eating habits. History of falls. Memory and ability to understand (cognition). Work and work Astronomer. Reproductive health. Screening  You may have the following tests or measurements: Height, weight, and BMI. Blood pressure. Lipid and cholesterol levels. These may be checked every 5 years, or more frequently if you are over 59 years old. Skin check. Lung cancer screening. You may have this screening every year starting at age 74 if you have a 30-pack-year history of smoking and currently smoke or have quit within the past 15 years. Fecal occult blood test (FOBT) of the stool. You may have this test every year starting at age 75. Flexible sigmoidoscopy or colonoscopy. You may have a sigmoidoscopy every 5 years or a colonoscopy every 10 years starting at age 62. Hepatitis C blood test. Hepatitis B blood test. Sexually transmitted disease (STD) testing. Diabetes screening. This is done by checking your blood sugar (glucose) after you have not eaten for a while (fasting). You may have this done every 1-3 years. Bone density scan. This is done to screen for osteoporosis. You may have this done starting at age 51. Mammogram. This may be done every 1-2 years. Talk to your health care provider about how often you should have regular mammograms. Talk with your health care provider about your test results, treatment options, and if necessary, the need  for more tests. Vaccines  Your health care provider may recommend certain vaccines, such as: Influenza vaccine. This is  recommended every year. Tetanus, diphtheria, and acellular pertussis (Tdap, Td) vaccine. You may need a Td booster every 10 years. Zoster vaccine. You may need this after age 61. Pneumococcal 13-valent conjugate (PCV13) vaccine. One dose is recommended after age 2. Pneumococcal polysaccharide (PPSV23) vaccine. One dose is recommended after age 36. Talk to your health care provider about which screenings and vaccines you need and how often you need them. This information is not intended to replace advice given to you by your health care provider. Make sure you discuss any questions you have with your health care provider. Document Released: 08/31/2015 Document Revised: 04/23/2016 Document Reviewed: 06/05/2015 Elsevier Interactive Patient Education  2017 [place] Prevention in the Home Falls can cause injuries. They can happen to people of all ages. There are many things you can do to make your home safe and to help prevent falls. What can I do on the outside of my home? Regularly fix the edges of walkways and driveways and fix any cracks. Remove anything that might make you trip as you walk through a door, such as a raised step or threshold. Trim any bushes or trees on the path to your home. Use bright outdoor lighting. Clear any walking paths of anything that might make someone trip, such as rocks or tools. Regularly check to see if handrails are loose or broken. Make sure that both sides of any steps have handrails. Any raised decks and porches should have guardrails on the edges. Have any leaves, snow, or ice cleared regularly. Use sand or salt on walking paths during winter. Clean up any spills in your garage right away. This includes oil or grease spills. What can I do in the bathroom? Use night lights. Install grab bars by the toilet and in the tub and shower. Do not use towel bars as grab bars. Use non-skid mats or decals in the tub or shower. If you need to sit down in  the shower, use a plastic, non-slip stool. Keep the floor dry. Clean up any water that spills on the floor as soon as it happens. Remove soap buildup in the tub or shower regularly. Attach bath mats securely with double-sided non-slip rug tape. Do not have throw rugs and other things on the floor that can make you trip. What can I do in the bedroom? Use night lights. Make sure that you have a light by your bed that is easy to reach. Do not use any sheets or blankets that are too big for your bed. They should not hang down onto the floor. Have a firm chair that has side arms. You can use this for support while you get dressed. Do not have throw rugs and other things on the floor that can make you trip. What can I do in the kitchen? Clean up any spills right away. Avoid walking on wet floors. Keep items that you use a lot in easy-to-reach places. If you need to reach something above you, use a strong step stool that has a grab bar. Keep electrical cords out of the way. Do not use floor polish or wax that makes floors slippery. If you must use wax, use non-skid floor wax. Do not have throw rugs and other things on the floor that can make you trip. What can I do with my stairs? Do not leave any items on the stairs.  Make sure that there are handrails on both sides of the stairs and use them. Fix handrails that are broken or loose. Make sure that handrails are as long as the stairways. Check any carpeting to make sure that it is firmly attached to the stairs. Fix any carpet that is loose or worn. Avoid having throw rugs at the top or bottom of the stairs. If you do have throw rugs, attach them to the floor with carpet tape. Make sure that you have a light switch at the top of the stairs and the bottom of the stairs. If you do not have them, ask someone to add them for you. What else can I do to help prevent falls? Wear shoes that: Do not have high heels. Have rubber bottoms. Are comfortable  and fit you well. Are closed at the toe. Do not wear sandals. If you use a stepladder: Make sure that it is fully opened. Do not climb a closed stepladder. Make sure that both sides of the stepladder are locked into place. Ask someone to hold it for you, if possible. Clearly mark and make sure that you can see: Any grab bars or handrails. First and last steps. Where the edge of each step is. Use tools that help you move around (mobility aids) if they are needed. These include: Canes. Walkers. Scooters. Crutches. Turn on the lights when you go into a dark area. Replace any light bulbs as soon as they burn out. Set up your furniture so you have a clear path. Avoid moving your furniture around. If any of your floors are uneven, fix them. If there are any pets around you, be aware of where they are. Review your medicines with your doctor. Some medicines can make you feel dizzy. This can increase your chance of falling. Ask your doctor what other things that you can do to help prevent falls. This information is not intended to replace advice given to you by your health care provider. Make sure you discuss any questions you have with your health care provider. Document Released: 05/31/2009 Document Revised: 01/10/2016 Document Reviewed: 09/08/2014 Elsevier Interactive Patient Education  2017 [employer].

## 2021-06-21 NOTE — Progress Notes (Signed)
Subjective:   Kimberly Davis is a 75 y.o. female who presents for Medicare Annual (Subsequent) preventive examination.  Virtual Visit via Telephone Note  I connected with  Kimberly Davis on 06/21/21 at  9:00 AM EDT by telephone and verified that I am speaking with the correct person using two identifiers.  Location: Patient: Home Provider: [facility] Persons participating in the virtual visit: patient/Nurse Health Advisor   I discussed the limitations, risks, security and privacy concerns of performing an evaluation and management service by telephone and the availability of in person appointments. The patient expressed understanding and agreed to proceed.  Interactive audio and video telecommunications were attempted between this nurse and patient, however failed, due to patient having technical difficulties OR patient did not have access to video capability.  We continued and completed visit with audio only.  Some vital signs may be absent or patient reported.   Cici Rodriges E Mariah Harn, LPN   Review of Systems     Cardiac Risk Factors include: advanced age (>13men, >56 women);sedentary lifestyle     Objective:    Today's Vitals   06/21/21 0859  Weight: 184 lb (83.5 kg)  Height: 5\' 7"  (1.702 m)   Body mass index is 28.82 kg/m.  Advanced Directives 06/21/2021 06/30/2017 04/04/2016 12/28/2015 12/28/2015  Does Patient Have a Medical Advance Directive? No No No No No  Would patient like information on creating a medical advance directive? No - Patient declined Yes (MAU/Ambulatory/Procedural Areas - Information given) Yes - Educational materials given No - patient declined information No - patient declined information    Current Medications (verified) Outpatient Encounter Medications as of 06/21/2021  Medication Sig   Calcium 600-200 MG-UNIT tablet Take 1 tablet by mouth 2 (two) times daily.   Cholecalciferol (VITAMIN D-3) 1000 units CAPS Take by mouth. As needed   pantoprazole (PROTONIX) 40 MG  tablet Take 1 tablet (40 mg total) by mouth daily before breakfast.   No facility-administered encounter medications on file as of 06/21/2021.    Allergies (verified) Patient has no known allergies.   History: Past Medical History:  Diagnosis Date   GERD (gastroesophageal reflux disease)    Past Surgical History:  Procedure Laterality Date   ESOPHAGOGASTRODUODENOSCOPY N/A 12/28/2015   Dr. 02/27/2016: large hiatal hernia, gastric ulcer, gastritis. bx positive for h.pylori    ESOPHAGOGASTRODUODENOSCOPY N/A 04/04/2016   Dr. 04/06/2016: gastritis, hiatal hernia. GU healed. Bx negative for H.pylori   TUBAL LIGATION     Family History  Problem Relation Age of Onset   Stroke Mother    Heart disease Father    Stroke Sister    Healthy Daughter    Healthy Son    Alzheimer's disease Sister    COPD Brother    Stroke Brother    Healthy Daughter    Colon cancer Neg Hx    Social History   Socioeconomic History   Marital status: Married    Spouse name: Not on file   Number of children: 3   Years of education: Not on file   Highest education level: Not on file  Occupational History   Occupation: [employer]    Comment: [employer]-3/4 time  Tobacco Use   Smoking status: Never   Smokeless tobacco: Never  Vaping Use   Vaping Use: Never used  Substance and Sexual Activity   Alcohol use: No   Drug use: No   Sexual activity: Yes  Other Topics Concern   Not on file  Social History Narrative  Not on file   Social Determinants of Health   Financial Resource Strain: Not on file  Food Insecurity: Not on file  Transportation Needs: Not on file  Physical Activity: Insufficiently Active   Days of Exercise per Week: 3 days   Minutes of Exercise per Session: 30 min  Stress: No Stress Concern Present   Feeling of Stress : Not at all  Social Connections: Not on file    Tobacco Counseling Counseling given: Not Answered   Clinical Intake:  Pre-visit preparation  completed: Yes  Pain : No/denies pain     BMI - recorded: 28.82 Nutritional Status: BMI 25 -29 Overweight Nutritional Risks: None Diabetes: No  How often do you need to have someone help you when you read instructions, pamphlets, or other written materials from your doctor or pharmacy?: 1 - Never  Diabetic? no  Interpreter Needed?: No  Information entered by :: Jensyn Shave, LPN   Activities of Daily Living In your present state of health, do you have any difficulty performing the following activities: 06/21/2021  Hearing? N  Vision? N  Difficulty concentrating or making decisions? N  Walking or climbing stairs? N  Dressing or bathing? N  Doing errands, shopping? N  Preparing Food and eating ? N  Using the Toilet? N  In the past six months, have you accidently leaked urine? N  Do you have problems with loss of bowel control? N  Managing your Medications? N  Managing your Finances? N  Housekeeping or managing your Housekeeping? N  Some recent data might be hidden    Patient Care Team: Raliegh Ip, DO as PCP - General (Family Medicine) West Bali, MD (Inactive) as Consulting Physician (Gastroenterology)  Indicate any recent Medical Services you may have received from other than [facility] providers in the past year (date may be approximate).     Assessment:   This is a routine wellness examination for [facility].  Hearing/Vision screen Hearing Screening - Comments:: Denies hearing difficulties  Vision Screening - Comments:: Wears contact lenses - up to date with annual eye exams with [facility]  Dietary issues and exercise activities discussed: Current Exercise Habits: Home exercise routine, Type of exercise: walking, Time (Minutes): 30, Frequency (Times/Week): 3, Weekly Exercise (Minutes/Week): 90, Intensity: Mild, Exercise limited by: None identified   Goals Addressed             This Visit's Progress    Exercise 3x per week (30 min per time)          Depression Screen PHQ 2/9 Scores 05/10/2019 10/04/2018 01/18/2018 11/11/2017 09/09/2017 06/30/2017 06/26/2017  PHQ - 2 Score 0 0 0 0 0 0 0    Fall Risk Fall Risk  06/21/2021 05/10/2019 10/04/2018 11/11/2017 09/09/2017  Falls in the past year? 0 0 0 No No  Number falls in past yr: 0 - - - -  Injury with Fall? 0 - - - -  Risk for fall due to : No Fall Risks - - - -  Follow up Falls prevention discussed - - - -    FALL RISK PREVENTION PERTAINING TO THE HOME:  Any stairs in or around the home? Yes  If so, are there any without handrails? No  Home free of loose throw rugs in walkways, pet beds, electrical cords, etc? Yes  Adequate lighting in your home to reduce risk of falls? Yes   ASSISTIVE DEVICES UTILIZED TO PREVENT FALLS:  Life alert? No  Use of a cane,  walker or w/c? No  Grab bars in the bathroom? Yes  Shower chair or bench in shower? No  Elevated toilet seat or a handicapped toilet? No   TIMED UP AND GO:  Was the test performed? No . Telephonic visit  Cognitive Function: MMSE - Mini Mental State Exam 07/01/2017  Orientation to time 5  Orientation to Place 5  Registration 3  Attention/ Calculation 5  Recall 3  Language- name 2 objects 2  Language- repeat 1  Language- follow 3 step command 3  Language- read & follow direction 1  Write a sentence 1  Copy design 1  Total score 30     6CIT Screen 06/21/2021  What Year? 0 points  What month? 0 points  What time? 0 points  Count back from 20 0 points  Months in reverse 0 points  Repeat phrase 0 points  Total Score 0    Immunizations Immunization History  Administered Date(s) Administered   Fluad Quad(high Dose 65+) 05/10/2019   Influenza, High Dose Seasonal PF 06/16/2017   Influenza-Unspecified 06/19/2021   Moderna Sars-Covid-2 Vaccination 10/18/2019, 11/15/2019   Pneumococcal Conjugate-13 05/10/2019   Pneumococcal Polysaccharide-23 12/29/2015    TDAP status: Due, Education has been provided regarding the  importance of this vaccine. Advised may receive this vaccine at local pharmacy or Health Dept. Aware to provide a copy of the vaccination record if obtained from local pharmacy or Health Dept. Verbalized acceptance and understanding.  Flu Vaccine status: Up to date  Pneumococcal vaccine status: Up to date  Covid-19 vaccine status: Completed vaccines  Qualifies for Shingles Vaccine? Yes   Zostavax completed No   Shingrix Completed?: No.    Education has been provided regarding the importance of this vaccine. Patient has been advised to call insurance company to determine out of pocket expense if they have not yet received this vaccine. Advised may also receive vaccine at local pharmacy or Health Dept. Verbalized acceptance and understanding.  Screening Tests Health Maintenance  Topic Date Due   Hepatitis C Screening  Never done   TETANUS/TDAP  Never done   Zoster Vaccines- Shingrix (1 of 2) Never done   COVID-19 Vaccine (3 - Booster for Moderna series) 01/10/2020   Pneumonia Vaccine 73+ Years old  Completed   INFLUENZA VACCINE  Completed   DEXA SCAN  Completed   HPV VACCINES  Aged Out    Health Maintenance  Health Maintenance Due  Topic Date Due   Hepatitis C Screening  Never done   TETANUS/TDAP  Never done   Zoster Vaccines- Shingrix (1 of 2) Never done   COVID-19 Vaccine (3 - Booster for Moderna series) 01/10/2020    Colorectal cancer screening: No longer required.   Mammogram status: Ordered 2022. Pt provided with contact info and advised to call to schedule appt.  - she has had several follow ups for abnormal mammogram recently - has repeat diagnostic mammo plus ultrasound scheduled for 06/27/2021  Bone Density status: Completed 07/05/2019. Results reflect: Bone density results: OSTEOPENIA. Repeat every 2 years.  Lung Cancer Screening: (Low Dose CT Chest recommended if Age 9-80 years, 30 pack-year currently smoking OR have quit w/in 15years.) does not qualify.    Additional Screening:  Hepatitis C Screening: does qualify; Due  Vision Screening: Recommended annual ophthalmology exams for early detection of glaucoma and other disorders of the eye. Is the patient up to date with their annual eye exam?  Yes  Who is the provider or what is the name of the  office in which the patient attends annual eye exams? [facility] If pt is not established with a provider, would they like to be referred to a provider to establish care? No .   Dental Screening: Recommended annual dental exams for proper oral hygiene  Community Resource Referral / Chronic Care Management: CRR required this visit?  No   CCM required this visit?  No      Plan:     I have personally reviewed and noted the following in the patient's chart:   Medical and social history Use of alcohol, tobacco or illicit drugs  Current medications and supplements including opioid prescriptions.  Functional ability and status Nutritional status Physical activity Advanced directives List of other physicians Hospitalizations, surgeries, and ER visits in previous 12 months Vitals Screenings to include cognitive, depression, and falls Referrals and appointments  In addition, I have reviewed and discussed with patient certain preventive protocols, quality metrics, and best practice recommendations. A written personalized care plan for preventive services as well as general preventive health recommendations were provided to patient.     [place] Constable, LPN   [id number]   Nurse Notes: None

## 2021-06-27 ENCOUNTER — Ambulatory Visit
Admission: RE | Admit: 2021-06-27 | Discharge: 2021-06-27 | Disposition: A | Discharge status: Home / Self Care | Payer: PPO | Source: Ambulatory Visit | Attending: Family Medicine | Admitting: Family Medicine

## 2021-06-27 ENCOUNTER — Other Ambulatory Visit: Payer: Self-pay

## 2021-06-27 DIAGNOSIS — R928 Other abnormal and inconclusive findings on diagnostic imaging of breast: Secondary | ICD-10-CM

## 2021-06-27 DIAGNOSIS — R922 Inconclusive mammogram: Secondary | ICD-10-CM | POA: Diagnosis not present

## 2022-07-03 ENCOUNTER — Other Ambulatory Visit: Payer: Self-pay | Admitting: Family Medicine

## 2022-07-03 DIAGNOSIS — Z1231 Encounter for screening mammogram for malignant neoplasm of breast: Secondary | ICD-10-CM

## 2022-08-04 ENCOUNTER — Other Ambulatory Visit: Payer: Self-pay | Admitting: Family Medicine

## 2022-08-04 DIAGNOSIS — N6320 Unspecified lump in the left breast, unspecified quadrant: Secondary | ICD-10-CM

## 2022-09-23 ENCOUNTER — Ambulatory Visit
Admission: RE | Admit: 2022-09-23 | Discharge: 2022-09-23 | Disposition: A | Discharge status: Home / Self Care | Payer: PPO | Source: Ambulatory Visit | Attending: Family Medicine | Admitting: Family Medicine

## 2022-09-23 DIAGNOSIS — N6320 Unspecified lump in the left breast, unspecified quadrant: Secondary | ICD-10-CM

## 2022-10-06 ENCOUNTER — Ambulatory Visit (INDEPENDENT_AMBULATORY_CARE_PROVIDER_SITE_OTHER): Payer: PPO

## 2022-10-06 DIAGNOSIS — Z780 Asymptomatic menopausal state: Secondary | ICD-10-CM | POA: Diagnosis not present

## 2022-10-06 DIAGNOSIS — Z0000 Encounter for general adult medical examination without abnormal findings: Secondary | ICD-10-CM | POA: Diagnosis not present

## 2022-10-06 NOTE — Patient Instructions (Signed)
Kimberly Davis , Thank you for taking time to come for your Medicare Wellness Visit. I appreciate your ongoing commitment to your health goals. Please review the following plan we discussed and let me know if I can assist you in the future.   These are the goals we discussed:  Goals      Exercise 3x per week (30 min per time)        This is a list of the screening recommended for you and due dates:  Health Maintenance  Topic Date Due   Hepatitis C Screening: USPSTF Recommendation to screen - Ages 34-79 yo.  Never done   DTaP/Tdap/Td vaccine (1 - Tdap) Never done   Zoster (Shingles) Vaccine (1 of 2) Never done   Flu Shot  03/18/2022   COVID-19 Vaccine (3 - 2023-24 season) 04/18/2022   Medicare Annual Wellness Visit  10/07/2023   Pneumonia Vaccine  Completed   DEXA scan (bone density measurement)  Completed   HPV Vaccine  Aged Out    Advanced directives: Advance directive discussed with you today. I have provided a copy for you to complete at home and have notarized. Once this is complete please bring a copy in to our office so we can scan it into your chart.   Conditions/risks identified: Aim for 30 minutes of exercise or brisk walking, 6-8 glasses of water, and 5 servings of fruits and vegetables each day.   Next appointment: Follow up in one year for your annual wellness visit   The number to schedule your bone density at [facility] is [phone]   Preventive Care 65 Years and Older, Female Preventive care refers to lifestyle choices and visits with your health care provider that can promote health and wellness. What does preventive care include? A yearly physical exam. This is also called an annual well check. Dental exams once or twice a year. Routine eye exams. Ask your health care provider how often you should have your eyes checked. Personal lifestyle choices, including: Daily care of your teeth and gums. Regular physical activity. Eating a healthy diet. Avoiding  tobacco and drug use. Limiting alcohol use. Practicing safe sex. Taking low-dose aspirin every day. Taking vitamin and mineral supplements as recommended by your health care provider. What happens during an annual well check? The services and screenings done by your health care provider during your annual well check will depend on your age, overall health, lifestyle risk factors, and family history of disease. Counseling  Your health care provider may ask you questions about your: Alcohol use. Tobacco use. Drug use. Emotional well-being. Home and relationship well-being. Sexual activity. Eating habits. History of falls. Memory and ability to understand (cognition). Work and work Statistician. Reproductive health. Screening  You may have the following tests or measurements: Height, weight, and BMI. Blood pressure. Lipid and cholesterol levels. These may be checked every 5 years, or more frequently if you are over 80 years old. Skin check. Lung cancer screening. You may have this screening every year starting at age 73 if you have a 30-pack-year history of smoking and currently smoke or have quit within the past 15 years. Fecal occult blood test (FOBT) of the stool. You may have this test every year starting at age 60. Flexible sigmoidoscopy or colonoscopy. You may have a sigmoidoscopy every 5 years or a colonoscopy every 10 years starting at age 52. Hepatitis C blood test. Hepatitis B blood test. Sexually transmitted disease (STD) testing. Diabetes screening. This is done by  checking your blood sugar (glucose) after you have not eaten for a while (fasting). You may have this done every 1-3 years. Bone density scan. This is done to screen for osteoporosis. You may have this done starting at age 30. Mammogram. This may be done every 1-2 years. Talk to your health care provider about how often you should have regular mammograms. Talk with your health care provider about your test  results, treatment options, and if necessary, the need for more tests. Vaccines  Your health care provider may recommend certain vaccines, such as: Influenza vaccine. This is recommended every year. Tetanus, diphtheria, and acellular pertussis (Tdap, Td) vaccine. You may need a Td booster every 10 years. Zoster vaccine. You may need this after age 17. Pneumococcal 13-valent conjugate (PCV13) vaccine. One dose is recommended after age 85. Pneumococcal polysaccharide (PPSV23) vaccine. One dose is recommended after age 72. Talk to your health care provider about which screenings and vaccines you need and how often you need them. This information is not intended to replace advice given to you by your health care provider. Make sure you discuss any questions you have with your health care provider. Document Released: 08/31/2015 Document Revised: 04/23/2016 Document Reviewed: 06/05/2015 Elsevier Interactive Patient Education  2017 [place] Prevention in the Home Falls can cause injuries. They can happen to people of all ages. There are many things you can do to make your home safe and to help prevent falls. What can I do on the outside of my home? Regularly fix the edges of walkways and driveways and fix any cracks. Remove anything that might make you trip as you walk through a door, such as a raised step or threshold. Trim any bushes or trees on the path to your home. Use bright outdoor lighting. Clear any walking paths of anything that might make someone trip, such as rocks or tools. Regularly check to see if handrails are loose or broken. Make sure that both sides of any steps have handrails. Any raised decks and porches should have guardrails on the edges. Have any leaves, snow, or ice cleared regularly. Use sand or salt on walking paths during winter. Clean up any spills in your garage right away. This includes oil or grease spills. What can I do in the bathroom? Use night  lights. Install grab bars by the toilet and in the tub and shower. Do not use towel bars as grab bars. Use non-skid mats or decals in the tub or shower. If you need to sit down in the shower, use a plastic, non-slip stool. Keep the floor dry. Clean up any water that spills on the floor as soon as it happens. Remove soap buildup in the tub or shower regularly. Attach bath mats securely with double-sided non-slip rug tape. Do not have throw rugs and other things on the floor that can make you trip. What can I do in the bedroom? Use night lights. Make sure that you have a light by your bed that is easy to reach. Do not use any sheets or blankets that are too big for your bed. They should not hang down onto the floor. Have a firm chair that has side arms. You can use this for support while you get dressed. Do not have throw rugs and other things on the floor that can make you trip. What can I do in the kitchen? Clean up any spills right away. Avoid walking on wet floors. Keep items that you use a  lot in easy-to-reach places. If you need to reach something above you, use a strong step stool that has a grab bar. Keep electrical cords out of the way. Do not use floor polish or wax that makes floors slippery. If you must use wax, use non-skid floor wax. Do not have throw rugs and other things on the floor that can make you trip. What can I do with my stairs? Do not leave any items on the stairs. Make sure that there are handrails on both sides of the stairs and use them. Fix handrails that are broken or loose. Make sure that handrails are as long as the stairways. Check any carpeting to make sure that it is firmly attached to the stairs. Fix any carpet that is loose or worn. Avoid having throw rugs at the top or bottom of the stairs. If you do have throw rugs, attach them to the floor with carpet tape. Make sure that you have a light switch at the top of the stairs and the bottom of the stairs. If  you do not have them, ask someone to add them for you. What else can I do to help prevent falls? Wear shoes that: Do not have high heels. Have rubber bottoms. Are comfortable and fit you well. Are closed at the toe. Do not wear sandals. If you use a stepladder: Make sure that it is fully opened. Do not climb a closed stepladder. Make sure that both sides of the stepladder are locked into place. Ask someone to hold it for you, if possible. Clearly mark and make sure that you can see: Any grab bars or handrails. First and last steps. Where the edge of each step is. Use tools that help you move around (mobility aids) if they are needed. These include: Canes. Walkers. Scooters. Crutches. Turn on the lights when you go into a dark area. Replace any light bulbs as soon as they burn out. Set up your furniture so you have a clear path. Avoid moving your furniture around. If any of your floors are uneven, fix them. If there are any pets around you, be aware of where they are. Review your medicines with your doctor. Some medicines can make you feel dizzy. This can increase your chance of falling. Ask your doctor what other things that you can do to help prevent falls. This information is not intended to replace advice given to you by your health care provider. Make sure you discuss any questions you have with your health care provider. Document Released: 05/31/2009 Document Revised: 01/10/2016 Document Reviewed: 09/08/2014 Elsevier Interactive Patient Education  2017 [employer].

## 2022-10-06 NOTE — Progress Notes (Signed)
Subjective:   Kimberly Davis is a 77 y.o. female who presents for Medicare Annual (Subsequent) preventive examination.  I connected with  Kimberly Davis on 10/06/22 by a audio enabled telemedicine application and verified that I am speaking with the correct person using two identifiers.  Patient Location: Home  Provider Location: Home Office  I discussed the limitations of evaluation and management by telemedicine. The patient expressed understanding and agreed to proceed.  Review of Systems     Cardiac Risk Factors include: advanced age (>12mn, >>25women)     Objective:    Today's Vitals   There is no height or weight on file to calculate BMI.     10/06/2022    2:59 PM 06/21/2021    9:09 AM 06/30/2017   11:25 AM 04/04/2016    2:42 PM 12/28/2015    3:34 AM 12/28/2015   12:27 AM  Advanced Directives  Does Patient Have a Medical Advance Directive? No No No No No No  Would patient like information on creating a medical advance directive? Yes (MAU/Ambulatory/Procedural Areas - Information given) No - Patient declined Yes (MAU/Ambulatory/Procedural Areas - Information given) Yes - Educational materials given No - patient declined information No - patient declined information    Current Medications (verified) Outpatient Encounter Medications as of 10/06/2022  Medication Sig   Calcium 600-200 MG-UNIT tablet Take 1 tablet by mouth 2 (two) times daily.   Cholecalciferol (VITAMIN D-3) 1000 units CAPS Take by mouth. As needed   pantoprazole (PROTONIX) 40 MG tablet Take 1 tablet (40 mg total) by mouth daily before breakfast. (Patient not taking: Reported on 10/06/2022)   No facility-administered encounter medications on file as of 10/06/2022.    Allergies (verified) Patient has no known allergies.   History: Past Medical History:  Diagnosis Date   GERD (gastroesophageal reflux disease)    Past Surgical History:  Procedure Laterality Date   ESOPHAGOGASTRODUODENOSCOPY N/A 12/28/2015    Dr. FOneida Alar large hiatal hernia, gastric ulcer, gastritis. bx positive for h.pylori    ESOPHAGOGASTRODUODENOSCOPY N/A 04/04/2016   Dr. FOneida Alar gastritis, hiatal hernia. GU healed. 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  Tobacco Use   Smoking status: Never   Smokeless tobacco: Never  Vaping Use   Vaping Use: Never used  Substance and Sexual Activity   Alcohol use: No   Drug use: No   Sexual activity: Yes  Other Topics Concern   Not on file  Social History Narrative   Not on file   Social Determinants of Health   Financial Resource Strain: Low Risk  (10/06/2022)   Overall Financial Resource Strain (CARDIA)    Difficulty of Paying Living Expenses: Not hard at all  Food Insecurity: No Food Insecurity (10/06/2022)   Hunger Vital Sign  Worried About Charity fundraiser in the Last Year: Never true    [place] in the Last Year: Never true  Transportation Needs: No Transportation Needs (10/06/2022)   PRAPARE - Hydrologist (Medical): No    Lack of Transportation (Non-Medical): No  Physical Activity: Sufficiently Active (10/06/2022)   Exercise Vital Sign    Days of Exercise per Week: 5 days    Minutes of Exercise per Session: 30 min  Stress: No Stress Concern Present (10/06/2022)   [place]    Feeling of Stress : Not at all   Social Connections: Moderately Integrated (10/06/2022)   Social Connection and Isolation Panel [NHANES]    Frequency of Communication with Friends and Family: More than three times a week    Frequency of Social Gatherings with Friends and Family: More than three times a week    Attends Religious Services: More than 4 times per year    Active Member of [employer] or Organizations: No    Attends Music therapist: Never    Marital Status: Married    Tobacco Counseling Counseling given: Not Answered   Clinical Intake:  Pre-visit preparation completed: Yes  Pain : No/denies pain  Diabetes: No  How often do you need to have someone help you when you read instructions, pamphlets, or other written materials from your doctor or pharmacy?: 1 - Never  Diabetic?No   Interpreter Needed?: No  Information entered by :: Denman George LPN   Activities of Daily Living    10/06/2022    2:59 PM  In your present state of health, do you have any difficulty performing the following activities:  Hearing? 0  Vision? 0  Difficulty concentrating or making decisions? 0  Walking or climbing stairs? 0  Dressing or bathing? 0  Doing errands, shopping? 0  Preparing Food and eating ? N  Using the Toilet? N  In the past six months, have you accidently leaked urine? N  Do you have problems with loss of bowel control? N  Managing your Medications? N  Managing your Finances? N  Housekeeping or managing your Housekeeping? N    Patient Care Team: Janora Norlander, DO as PCP - General (Family Medicine) Danie Binder, MD (Inactive) as Consulting Physician (Gastroenterology)  Indicate any recent Medical Services you may have received from other than [facility] providers in the past year (date may be approximate).     Assessment:   This is a routine wellness examination for [facility].  Hearing/Vision screen Hearing Screening - Comments:: Denies hearing difficulties  Vision Screening -  Comments:: No vision problems   Dietary issues and exercise activities discussed: Current Exercise Habits: Home exercise routine, Type of exercise: walking, Time (Minutes): 30, Frequency (Times/Week): 5, Weekly Exercise (Minutes/Week): 150, Intensity: Mild   Goals Addressed   None    Depression Screen    10/06/2022    2:53 PM 05/10/2019    9:53 AM 10/04/2018    4:09 PM 01/18/2018    5:28 PM 11/11/2017   10:41 AM 09/09/2017    1:50 PM 06/30/2017   11:25 AM  PHQ 2/9 Scores  PHQ - 2 Score 0 0 0 0 0 0 0    Fall Risk    10/06/2022    2:50 PM 06/21/2021    9:09 AM 05/10/2019    9:53 AM 10/04/2018    4:09 PM 11/11/2017   10:41 AM  Fall  Risk   Falls in the past year? 0 0 0 0 No  Number falls in past yr: 0 0     Injury with Fall? 0 0     Risk for fall due to :  No Fall Risks     Follow up Falls prevention discussed;Education provided;Falls evaluation completed Falls prevention discussed       FALL RISK PREVENTION PERTAINING TO THE HOME:  Any stairs in or around the home? No  If so, are there any without handrails? No  Home free of loose throw rugs in walkways, pet beds, electrical cords, etc? Yes  Adequate lighting in your home to reduce risk of falls? Yes   ASSISTIVE DEVICES UTILIZED TO PREVENT FALLS:  Life alert? No  Use of a cane, walker or w/c? No  Grab bars in the bathroom? Yes  Shower chair or bench in shower? No  Elevated toilet seat or a handicapped toilet? Yes   TIMED UP AND GO:  Was the test performed? No . Telephonic visit   Cognitive Function:    07/01/2017    8:51 AM  MMSE - Mini Mental State Exam  Orientation to time 5  Orientation to Place 5  Registration 3  Attention/ Calculation 5  Recall 3  Language- name 2 objects 2  Language- repeat 1  Language- follow 3 step command 3  Language- read & follow direction 1  Write a sentence 1  Copy design 1  Total score 30        10/06/2022    2:59 PM 06/21/2021    9:10 AM  6CIT Screen  What Year? 0 points  0 points  What month? 0 points 0 points  What time? 0 points 0 points  Count back from 20 0 points 0 points  Months in reverse 0 points 0 points  Repeat phrase 0 points 0 points  Total Score 0 points 0 points    Immunizations Immunization History  Administered Date(s) Administered   Fluad Quad(high Dose 65+) 05/10/2019   Influenza, High Dose Seasonal PF 06/16/2017   Influenza-Unspecified 06/19/2021   Moderna Sars-Covid-2 Vaccination 10/18/2019, 11/15/2019   Pneumococcal Conjugate-13 05/10/2019   Pneumococcal Polysaccharide-23 12/29/2015    TDAP status: Due, Education has been provided regarding the importance of this vaccine. Advised may receive this vaccine at local pharmacy or Health Dept. Aware to provide a copy of the vaccination record if obtained from local pharmacy or Health Dept. Verbalized acceptance and understanding.  Flu Vaccine status: Declined, Education has been provided regarding the importance of this vaccine but patient still declined. Advised may receive this vaccine at local pharmacy or Health Dept. Aware to provide a copy of the vaccination record if obtained from local pharmacy or Health Dept. Verbalized acceptance and understanding.  Pneumococcal vaccine status: Due, Education has been provided regarding the importance of this vaccine. Advised may receive this vaccine at local pharmacy or Health Dept. Aware to provide a copy of the vaccination record if obtained from local pharmacy or Health Dept. Verbalized acceptance and understanding.  Covid-19 vaccine status: Information provided on how to obtain vaccines.   Qualifies for Shingles Vaccine? Yes   Zostavax completed No   Shingrix Completed?: No.    Education has been provided regarding the importance of this vaccine. Patient has been advised to call insurance company to determine out of pocket expense if they have not yet received this vaccine. Advised may also receive vaccine at local pharmacy or Health Dept.  Verbalized acceptance and understanding.  Screening Tests Health Maintenance  Topic Date Due   Hepatitis C Screening  Never done   DTaP/Tdap/Td (1 - Tdap) Never done   Zoster Vaccines- Shingrix (1 of 2) Never done   INFLUENZA VACCINE  03/18/2022   COVID-19 Vaccine (3 - 2023-24 season) 04/18/2022   Medicare Annual Wellness (AWV)  10/07/2023   Pneumonia Vaccine 23+ Years old  Completed   DEXA SCAN  Completed   HPV VACCINES  Aged Out    Health Maintenance  Health Maintenance Due  Topic Date Due   Hepatitis C Screening  Never done   DTaP/Tdap/Td (1 - Tdap) Never done   Zoster Vaccines- Shingrix (1 of 2) Never done   INFLUENZA VACCINE  03/18/2022   COVID-19 Vaccine (3 - 2023-24 season) 04/18/2022    Colorectal cancer screening: No longer required.   Mammogram status: Completed 09/23/22. Repeat every year  Bone Density status: Ordered today. Pt provided with contact info and advised to call to schedule appt.  Lung Cancer Screening: (Low Dose CT Chest recommended if Age 51-80 years, 30 pack-year currently smoking OR have quit w/in 15years.) does not qualify.   Lung Cancer Screening Referral: n/a  Additional Screening:  Hepatitis C Screening: does qualify; Completed at next office visit   Vision Screening: Recommended annual ophthalmology exams for early detection of glaucoma and other disorders of the eye. Is the patient up to date with their annual eye exam?  No  Who is the provider or what is the name of the office in which the patient attends annual eye exams? None  If pt is not established with a provider, would they like to be referred to a provider to establish care? No .   Dental Screening: Recommended annual dental exams for proper oral hygiene  Community Resource Referral / Chronic Care Management: CRR required this visit?  No   CCM required this visit?  No      Plan:     I have personally reviewed and noted the following in the patient's chart:   Medical  and social history Use of alcohol, tobacco or illicit drugs  Current medications and supplements including opioid prescriptions. Patient is not currently taking opioid prescriptions. Functional ability and status Nutritional status Physical activity Advanced directives List of other physicians Hospitalizations, surgeries, and ER visits in previous 12 months Vitals Screenings to include cognitive, depression, and falls Referrals and appointments  In addition, I have reviewed and discussed with patient certain preventive protocols, quality metrics, and best practice recommendations. A written personalized care plan for preventive services as well as general preventive health recommendations were provided to patient.     Vanetta Mulders, [place]   [id number]   Due to this being a virtual visit, the after visit summary with patients personalized plan was offered to patient via mail or my-chart.  Patient would like to access on my-chart  Nurse Notes: No concerns

## 2023-03-20 ENCOUNTER — Other Ambulatory Visit: Payer: PPO

## 2023-09-17 ENCOUNTER — Other Ambulatory Visit: Payer: Self-pay | Admitting: Family Medicine

## 2023-09-17 DIAGNOSIS — Z780 Asymptomatic menopausal state: Secondary | ICD-10-CM

## 2023-09-17 DIAGNOSIS — Z1231 Encounter for screening mammogram for malignant neoplasm of breast: Secondary | ICD-10-CM

## 2023-09-18 ENCOUNTER — Other Ambulatory Visit: Payer: PPO

## 2023-10-03 DIAGNOSIS — H04123 Dry eye syndrome of bilateral lacrimal glands: Secondary | ICD-10-CM | POA: Diagnosis not present

## 2023-10-03 DIAGNOSIS — H40033 Anatomical narrow angle, bilateral: Secondary | ICD-10-CM | POA: Diagnosis not present

## 2023-10-12 ENCOUNTER — Ambulatory Visit (INDEPENDENT_AMBULATORY_CARE_PROVIDER_SITE_OTHER): Payer: PPO

## 2023-10-12 VITALS — Ht 67.0 in | Wt 184.0 lb

## 2023-10-12 DIAGNOSIS — Z0000 Encounter for general adult medical examination without abnormal findings: Secondary | ICD-10-CM

## 2023-10-12 NOTE — Patient Instructions (Signed)
 Ms. Kandler , Thank you for taking time to come for your Medicare Wellness Visit. I appreciate your ongoing commitment to your health goals. Please review the following plan we discussed and let me know if I can assist you in the future.   Referrals/Orders/Follow-Ups/Clinician Recommendations: Aim for 30 minutes of exercise or brisk walking, 6-8 glasses of water, and 5 servings of fruits and vegetables each day.  This is a list of the screening recommended for you and due dates:  Health Maintenance  Topic Date Due   Hepatitis C Screening  Never done   DTaP/Tdap/Td vaccine (1 - Tdap) Never done   Zoster (Shingles) Vaccine (1 of 2) Never done   Flu Shot  03/19/2023   COVID-19 Vaccine (3 - 2024-25 season) 04/19/2023   Medicare Annual Wellness Visit  10/11/2024   Pneumonia Vaccine  Completed   DEXA scan (bone density measurement)  Completed   HPV Vaccine  Aged Out    Advanced directives: (ACP Link)Information on Advanced Care Planning can be found at [facility] [place] Advance Health Care Directives Advance Health Care Directives ([email or web address]   Next Medicare Annual Wellness Visit scheduled for next year: Yes

## 2023-10-12 NOTE — Progress Notes (Signed)
 Subjective:   Kimberly Davis is a 78 y.o. who presents for a Medicare Wellness preventive visit.  Visit Complete: Virtual I connected with  Kimberly Davis on 10/12/23 by a audio enabled telemedicine application and verified that I am speaking with the correct person using two identifiers.  Patient Location: Home  Provider Location: Home Office  I discussed the limitations of evaluation and management by telemedicine. The patient expressed understanding and agreed to proceed.  Vital Signs: Because this visit was a virtual/telehealth visit, some criteria may be missing or patient reported. Any vitals not documented were not able to be obtained and vitals that have been documented are patient reported.  VideoDeclined- This patient declined Librarian, academic. Therefore the visit was completed with audio only.  AWV Questionnaire: No: Patient Medicare AWV questionnaire was not completed prior to this visit.  Cardiac Risk Factors include: advanced age (>22men, >68 women)     Objective:    Today's Vitals   10/12/23 1450  Weight: 184 lb (83.5 kg)  Height: 5\' 7"  (1.702 m)   Body mass index is 28.82 kg/m.     10/12/2023    2:54 PM 10/06/2022    2:59 PM 06/21/2021    9:09 AM 06/30/2017   11:25 AM 04/04/2016    2:42 PM 12/28/2015    3:34 AM 12/28/2015   12:27 AM  Advanced Directives  Does Patient Have a Medical Advance Directive? No No No No No No No  Would patient like information on creating a medical advance directive? Yes (MAU/Ambulatory/Procedural Areas - Information given) Yes (MAU/Ambulatory/Procedural Areas - Information given) No - Patient declined Yes (MAU/Ambulatory/Procedural Areas - Information given) Yes - Educational materials given No - patient declined information No - patient declined information    Current Medications (verified) Outpatient Encounter Medications as of 10/12/2023  Medication Sig   Calcium 600-200 MG-UNIT tablet Take 1 tablet by  mouth 2 (two) times daily.   Cholecalciferol (VITAMIN D-3) 1000 units CAPS Take by mouth. As needed   pantoprazole (PROTONIX) 40 MG tablet Take 1 tablet (40 mg total) by mouth daily before breakfast. (Patient not taking: Reported on 10/12/2023)   No facility-administered encounter medications on file as of 10/12/2023.    Allergies (verified) Patient has no known allergies.   History: Past Medical History:  Diagnosis Date   GERD (gastroesophageal reflux disease)    Past Surgical History:  Procedure Laterality Date   ESOPHAGOGASTRODUODENOSCOPY N/A 12/28/2015   Dr. Darrick Penna: large hiatal hernia, gastric ulcer, gastritis. bx positive for h.pylori    ESOPHAGOGASTRODUODENOSCOPY N/A 04/04/2016   Dr. Darrick Penna: gastritis, hiatal hernia. GU healed. Bx negative for H.pylori   TUBAL LIGATION     Family History  Problem Relation Age of Onset   Stroke Mother    Heart disease Father    Stroke Sister    Healthy Daughter    Healthy Son    Alzheimer's disease Sister    COPD Brother    Stroke Brother    Healthy Daughter    Colon cancer Neg Hx    Social History   Socioeconomic History   Marital status: Married    Spouse name: Not on file   Number of children: 3   Years of education: Not on file   Highest education level: Not on file  Occupational History   Occupation: Engineer, structural    Comment: [employer]-3/4 time  Tobacco Use   Smoking status: Never   Smokeless tobacco: Never  Vaping Use  Vaping status: Never Used  Substance and Sexual Activity   Alcohol use: No   Drug use: No   Sexual activity: Yes  Other Topics Concern   Not on file  Social History Narrative   Not on file   Social Drivers of Health   Financial Resource Strain: Low Risk  (10/12/2023)   Overall Financial Resource Strain (CARDIA)    Difficulty of Paying Living Expenses: Not hard at all  Food Insecurity: No Food Insecurity (10/12/2023)   Hunger Vital Sign    Worried About Running Out of Food  in the Last Year: Never true    Ran Out of Food in the Last Year: Never true  Transportation Needs: No Transportation Needs (10/12/2023)   PRAPARE - Administrator, Civil Service (Medical): No    Lack of Transportation (Non-Medical): No  Physical Activity: Insufficiently Active (10/12/2023)   Exercise Vital Sign    Days of Exercise per Week: 3 days    Minutes of Exercise per Session: 30 min  Stress: No Stress Concern Present (10/12/2023)   [name]    Feeling of Stress : Not at all  Social Connections: Moderately Integrated (10/12/2023)   Social Connection and Isolation Panel [NHANES]    Frequency of Communication with Friends and Family: More than three times a week    Frequency of Social Gatherings with Friends and Family: Three times a week    Attends Religious Services: More than 4 times per year    Active Member of Clubs or Organizations: No    Attends Banker Meetings: Never    Marital Status: Married    Tobacco Counseling Counseling given: Not Answered    Clinical Intake:  Pre-visit preparation completed: Yes  Pain : No/denies pain     Diabetes: No  How often do you need to have someone help you when you read instructions, pamphlets, or other written materials from your doctor or pharmacy?: 1 - Never  Interpreter Needed?: No  Information entered by :: Kandis Fantasia LPN   Activities of Daily Living     10/12/2023    2:53 PM  In your present state of health, do you have any difficulty performing the following activities:  Hearing? 0  Vision? 0  Difficulty concentrating or making decisions? 0  Walking or climbing stairs? 0  Dressing or bathing? 0  Doing errands, shopping? 1  Comment due to caretaker role  Preparing Food and eating ? N  Using the Toilet? N  In the past six months, have you accidently leaked urine? N  Do you have problems with loss of bowel control? N   Managing your Medications? N  Managing your Finances? N  Housekeeping or managing your Housekeeping? N    Patient Care Team: Raliegh Ip, DO as PCP - General (Family Medicine) West Bali, MD (Inactive) as Consulting Physician (Gastroenterology)  Indicate any recent Medical Services you may have received from other than [facility] providers in the past year (date may be approximate).     Assessment:   This is a routine wellness examination for [facility].  Hearing/Vision screen Hearing Screening - Comments:: Denies hearing difficulties   Vision Screening - Comments:: Wears rx glasses - up to date with routine eye exams with Dr. Conley Rolls     Goals Addressed             This Visit's Progress    Remain active and independent  Depression Screen     10/12/2023    2:52 PM 10/06/2022    2:53 PM 05/10/2019    9:53 AM 10/04/2018    4:09 PM 01/18/2018    5:28 PM 11/11/2017   10:41 AM 09/09/2017    1:50 PM  PHQ 2/9 Scores  PHQ - 2 Score 0 0 0 0 0 0 0    Fall Risk     10/12/2023    2:53 PM 10/06/2022    2:50 PM 06/21/2021    9:09 AM 05/10/2019    9:53 AM 10/04/2018    4:09 PM  Fall Risk   Falls in the past year? 0 0 0 0 0  Number falls in past yr: 0 0 0    Injury with Fall? 0 0 0    Risk for fall due to : No Fall Risks  No Fall Risks    Follow up Falls prevention discussed;Education provided;Falls evaluation completed Falls prevention discussed;Education provided;Falls evaluation completed Falls prevention discussed      MEDICARE RISK AT HOME:  Medicare Risk at Home Any stairs in or around the home?: No If so, are there any without handrails?: No Home free of loose throw rugs in walkways, pet beds, electrical cords, etc?: Yes Adequate lighting in your home to reduce risk of falls?: Yes Life alert?: No Use of a cane, walker or w/c?: No Grab bars in the bathroom?: Yes Shower chair or bench in shower?: No Elevated toilet seat or a handicapped toilet?: Yes  TIMED UP AND  GO:  Was the test performed?  No  Cognitive Function: 6CIT completed    07/01/2017    8:51 AM  MMSE - Mini Mental State Exam  Orientation to time 5  Orientation to Place 5  Registration 3  Attention/ Calculation 5  Recall 3  Language- name 2 objects 2  Language- repeat 1  Language- follow 3 step command 3  Language- read & follow direction 1  Write a sentence 1  Copy design 1  Total score 30        10/12/2023    2:54 PM 10/06/2022    2:59 PM 06/21/2021    9:10 AM  6CIT Screen  What Year? 0 points 0 points 0 points  What month? 0 points 0 points 0 points  What time? 0 points 0 points 0 points  Count back from 20 0 points 0 points 0 points  Months in reverse 0 points 0 points 0 points  Repeat phrase 0 points 0 points 0 points  Total Score 0 points 0 points 0 points    Immunizations Immunization History  Administered Date(s) Administered   Fluad Quad(high Dose 65+) 05/10/2019   Influenza, High Dose Seasonal PF 06/16/2017   Influenza-Unspecified 06/19/2021   Moderna Sars-Covid-2 Vaccination 10/18/2019, 11/15/2019   Pneumococcal Conjugate-13 05/10/2019   Pneumococcal Polysaccharide-23 12/29/2015    Screening Tests Health Maintenance  Topic Date Due   Hepatitis C Screening  Never done   DTaP/Tdap/Td (1 - Tdap) Never done   Zoster Vaccines- Shingrix (1 of 2) Never done   INFLUENZA VACCINE  03/19/2023   COVID-19 Vaccine (3 - 2024-25 season) 04/19/2023   Medicare Annual Wellness (AWV)  10/11/2024   Pneumonia Vaccine 16+ Years old  Completed   DEXA SCAN  Completed   HPV VACCINES  Aged Out    Health Maintenance  Health Maintenance Due  Topic Date Due   Hepatitis C Screening  Never done   DTaP/Tdap/Td (1 - Tdap) Never done  Zoster Vaccines- Shingrix (1 of 2) Never done   INFLUENZA VACCINE  03/19/2023   COVID-19 Vaccine (3 - 2024-25 season) 04/19/2023    Additional Screening:  Vision Screening: Recommended annual ophthalmology exams for early detection of  glaucoma and other disorders of the eye.  Dental Screening: Recommended annual dental exams for proper oral hygiene  Community Resource Referral / Chronic Care Management: CRR required this visit?  No   CCM required this visit?  No     Plan:     I have personally reviewed and noted the following in the patient's chart:   Medical and social history Use of alcohol, tobacco or illicit drugs  Current medications and supplements including opioid prescriptions. Patient is not currently taking opioid prescriptions. Functional ability and status Nutritional status Physical activity Advanced directives List of other physicians Hospitalizations, surgeries, and ER visits in previous 12 months Vitals Screenings to include cognitive, depression, and falls Referrals and appointments  In addition, I have reviewed and discussed with patient certain preventive protocols, quality metrics, and best practice recommendations. A written personalized care plan for preventive services as well as general preventive health recommendations were provided to patient.     Kandis Fantasia [place], [place]   09/17/8655   After Visit Summary: (MyChart) Due to this being a telephonic visit, the after visit summary with patients personalized plan was offered to patient via MyChart   Notes: Nothing significant to report at this time.

## 2023-10-16 ENCOUNTER — Ambulatory Visit: Payer: PPO | Admitting: Family Medicine

## 2023-10-16 ENCOUNTER — Encounter: Payer: Self-pay | Admitting: Family Medicine

## 2023-10-16 VITALS — BP 126/74 | HR 80 | Ht 67.0 in

## 2023-10-16 DIAGNOSIS — E7800 Pure hypercholesterolemia, unspecified: Secondary | ICD-10-CM | POA: Diagnosis not present

## 2023-10-16 DIAGNOSIS — Z0000 Encounter for general adult medical examination without abnormal findings: Secondary | ICD-10-CM

## 2023-10-16 DIAGNOSIS — Z0001 Encounter for general adult medical examination with abnormal findings: Secondary | ICD-10-CM | POA: Diagnosis not present

## 2023-10-16 DIAGNOSIS — M810 Age-related osteoporosis without current pathological fracture: Secondary | ICD-10-CM

## 2023-10-16 DIAGNOSIS — Z23 Encounter for immunization: Secondary | ICD-10-CM | POA: Diagnosis not present

## 2023-10-16 DIAGNOSIS — E669 Obesity, unspecified: Secondary | ICD-10-CM | POA: Diagnosis not present

## 2023-10-16 DIAGNOSIS — R011 Cardiac murmur, unspecified: Secondary | ICD-10-CM | POA: Diagnosis not present

## 2023-10-16 DIAGNOSIS — Z6828 Body mass index (BMI) 28.0-28.9, adult: Secondary | ICD-10-CM

## 2023-10-16 LAB — BAYER DCA HB A1C WAIVED: HB A1C (BAYER DCA - WAIVED): 5.2 % (ref 4.8–5.6)

## 2023-10-16 NOTE — Patient Instructions (Signed)
 Aortic Valve Stenosis  Aortic valve stenosis is a narrowing of the aortic valve in the heart. The aortic valve opens and closes to regulate blood flow between the left side of the heart (left ventricle) and the artery that leads away from the heart (aorta). When the aortic valve becomes narrow, it is difficult for the heart to pump blood out to the body, which causes the heart to work harder. The extra work can weaken the heart muscle over time. Aortic valve stenosis can range from mild to severe. If it is not treated, it can become more severe over time and lead to heart failure. What are the causes? This condition may be caused by: Buildup of calcium around and on the aortic valve. This can occur with aging. This is the most common cause of aortic valve stenosis. A heart problem that developed in the womb (birth defect). Rheumatic fever. Radiation to the chest. What increases the risk? You are more likely to develop this condition if: You are 65 years and older. You were born with an abnormal bicuspid valve. What are the signs or symptoms? You may not have any symptoms until your condition becomes severe. It may take 10-20 years for mild or moderate aortic valve stenosis to become severe. Symptoms may include: Shortness of breath. This may get worse during physical activity. Feeling unusually weak and tired (fatigue). Extreme discomfort in the chest, neck, or arm during physical activity (angina). A heartbeat that is irregular or faster than normal (palpitations). Dizziness or fainting. This may happen when you get tired or after you take certain heart medicines, such as nitroglycerin. How is this diagnosed? This condition may be diagnosed with: A physical exam. Echocardiogram. This is a type of imaging test that uses sound waves (ultrasound) to make images of your heart. There are two kinds of this test that may be used. Transthoracic echocardiogram (TTE). For this type, a wand-like tool  (transducer) is moved over your chest to create ultrasound images that are recorded by a computer. Transesophageal echocardiogram (TEE). For this type, a flexible tube (probe) is inserted down the part of the body that moves food from your mouth to your stomach (esophagus). The heart and the esophagus are close to each other. Your health care provider will use the probe to take clear, detailed pictures of the heart. Cardiac catheterization. For this procedure, a small, thin tube (catheter) is passed through a large vein in your neck, groin, or arm. The catheter is used to get information about arteries, structures, blood pressure, and oxygen levels in your heart. Exercise stress tests. These are tests that check the blood supply to your heart and your heart's response to exercise. These tests help determine the severity of your condition. Cardiac magnetic resonance imaging (MRI). This test uses magnetic fields and radio waves to create detailed images of your heart. It is used to look at the size of your aorta. Computed tomography, or CT scan. This is a test that uses a series of X-rays and a computer to produce a 3D image of the heart. This test may be used to measure the size of your aorta and look at your aortic valve more closely. You may work with a health care provider who specializes in the heart (cardiologist) for diagnosis and treatment. How is this treated? Treatment depends on how severe your condition is and what your symptoms are. You will need to have your heart checked regularly to make sure that your condition is not getting  worse or causing serious problems. Treatment may include: Surgery to replace your aortic valve. This is the most common treatment for aortic valve stenosis, and it is the only treatment to cure the condition. Several types of surgeries are available. The surgery may be done: Through a large incision over your heart (open-heart surgery). Through small incisions, using a  flexible tube called a catheter (transcatheter aortic valve replacement, TAVR). Medicines that help to keep your heart rate regular. Medicines that thin your blood (anticoagulants) to prevent blood clots. Antibiotic medicines to help prevent infection. If your condition is mild, you may only need regular follow-up visits for monitoring. Follow these instructions at home: Lifestyle If you drink alcohol: Limit how much you have to: 0-1 drink a day for women. 0-2 drinks a day for men. Know how much alcohol is in your drink. In the [place] one drink equals one 12 oz bottle of beer (355 mL), one 5 oz glass of wine (148 mL), or one 1 oz glass of hard liquor (44 mL). Do not use any products that contain nicotine or tobacco. These products include cigarettes, chewing tobacco, and vaping devices, such as e-cigarettes. If you need help quitting, ask your health care provider. Work with your health care provider to manage your blood pressure and cholesterol. Maintain a healthy weight. Eating and drinking Follow instructions from your health care provider about eating or drinking restrictions. You may be told to: Eat a heart-healthy diet that includes plenty of fresh fruits and vegetables, whole grains, lean protein, and low-fat or nonfat dairy. Limit how much caffeine you drink. Caffeine can affect your heart's rate and rhythm. Avoid certain foods, including: Foods that are high in salt (sodium), saturated fat, or sugar. Canned or highly processed food. Fried foods.  Activity Exercise regularly. If your aortic valve stenosis is mild, you may only need to avoid very intense physical activity, such as heavy weight lifting. The more severe your aortic valve stenosis is, the more activities you may need to avoid. Return to your normal activities as told by your health care provider. Ask your health care provider what amount and type of physical activity is safe for you. If you are taking blood  thinners: Talk with your health care provider before you take any medicines that contain aspirin or NSAIDs, such as ibuprofen. These medicines increase your risk for dangerous bleeding. Take your medicine exactly as told, at the same time every day. Avoid activities that could cause injury or bruising. Follow instructions about how to prevent falls. Wear a medical alert bracelet or carry a card that lists what medicines you take. General instructions Take over-the-counter and prescription medicines only as told by your health care provider. If you were prescribed an antibiotic, take it as told by your health care provider. Do not stop taking the antibiotic even if you start to feel better. If you are a woman and you plan to become pregnant, talk with your health care provider before you become pregnant. Before you have any type of medical or dental procedure or surgery, tell all health care providers that you have aortic valve stenosis. This may affect treatment that you receive. Keep all follow-up visits. This is important. Contact a health care provider if: You have a fever. Get help right away if: You develop any of the following symptoms: Chest pain. Chest tightness. Shortness of breath. You feel light-headed. You feel like you might faint. Your heartbeat is irregular or faster than normal. These symptoms may be  an emergency. Get help right away. Call 911. Do not wait to see if the symptoms will go away. Do not drive yourself to the hospital. Summary Aortic valve stenosis is a narrowing of the aortic valve in the heart. The aortic valve regulates blood flow between the left ventricle and the aorta. If it is not treated, aortic valve stenosis can lead to heart failure. Treatment depends on how severe your condition is and what your symptoms are. You will need to have your heart checked regularly to make sure that your condition is not getting worse or causing serious problems. Exercise  regularly. Ask your health care provider what amount and type of physical activity is safe for you. This information is not intended to replace advice given to you by your health care provider. Make sure you discuss any questions you have with your health care provider. Document Revised: 05/05/2021 Document Reviewed: 05/05/2021 Elsevier Patient Education  2024 [employer].

## 2023-10-16 NOTE — Progress Notes (Signed)
 Kimberly Davis is a 78 y.o. female presents to office today for annual physical exam examination.    Concerns today include: 1.  None.  She has been doing relatively well.  She is accompanied today by her daughter and husband  Occupation: Retired, Marital status: Married to [place], Substance use: None There are no preventive care reminders to display for this patient. Refills needed today: N/A  Immunization History  Administered Date(s) Administered   Fluad Quad(high Dose 65+) 05/10/2019   Fluad Trivalent(High Dose 65+) 10/16/2023   Influenza, High Dose Seasonal PF 06/16/2017   Influenza-Unspecified 06/19/2021   Moderna Sars-Covid-2 Vaccination 10/18/2019, 11/15/2019   Pneumococcal Conjugate-13 05/10/2019   Pneumococcal Polysaccharide-23 12/29/2015   Tdap 10/16/2023   Zoster Recombinant(Shingrix) 10/16/2023   Past Medical History:  Diagnosis Date   GERD (gastroesophageal reflux disease)    Social History   Socioeconomic History   Marital status: Married    Spouse name: Fayrene Fearing   Number of children: 3   Years of education: Not on file   Highest education level: Not on file  Occupational History   Occupation: Engineer, structural    Comment: [employer]-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  She is the primary caregiver for him.  Her daughter is also very involved in helping them.   Social Drivers of Corporate investment banker Strain: Low Risk  (10/12/2023)   Overall Financial Resource Strain (CARDIA)    Difficulty of Paying Living Expenses: Not hard at all  Food Insecurity: No Food Insecurity (10/12/2023)   Hunger Vital Sign    Worried About Running Out  of Food in the Last Year: Never true    Ran Out of Food in the Last Year: Never true  Transportation Needs: No Transportation Needs (10/12/2023)   PRAPARE - Administrator, Civil Service (Medical): No    Lack of Transportation (Non-Medical): No  Physical Activity: Insufficiently Active (10/12/2023)   Exercise Vital Sign    Days of Exercise per Week: 3 days    Minutes of Exercise per Session: 30 min  Stress: No Stress Concern Present (10/12/2023)   [name]    Feeling of Stress : Not at all  Social Connections: Moderately Integrated (10/12/2023)   Social Connection and Isolation Panel [NHANES]    Frequency of Communication with Friends and Family: More than three times a week    Frequency of Social Gatherings with Friends and Family: Three times a week    Attends Religious Services: More than 4 times per year    Active Member of Clubs or Organizations: No    Attends Banker Meetings: Never    Marital Status: Married  Catering manager Violence: Not At Risk (10/12/2023)   Humiliation, Afraid, Rape, and Kick questionnaire    Fear of Current or Ex-Partner: No    Emotionally Abused: No    Physically Abused: No    Sexually Abused: No   Past Surgical History:  Procedure Laterality Date   ESOPHAGOGASTRODUODENOSCOPY N/A 12/28/2015   Dr. Darrick Penna: large hiatal hernia, gastric ulcer, gastritis. bx positive for  h.pylori    ESOPHAGOGASTRODUODENOSCOPY N/A 04/04/2016   Dr. Darrick Penna: gastritis, hiatal hernia. GU healed. Bx negative for H.pylori   TUBAL LIGATION     Family History  Problem Relation Age of Onset   Stroke Mother    Heart disease Father    Stroke Sister    Alzheimer's disease Sister    COPD Brother    Stroke Brother    Melanoma Daughter    Healthy Daughter    Healthy Son    Colon cancer Neg Hx     Current Outpatient Medications:    Calcium 600-200 MG-UNIT tablet, Take 1 tablet by mouth 2  (two) times daily., Disp: , Rfl:    Cholecalciferol (VITAMIN D-3) 1000 units CAPS, Take by mouth. As needed, Disp: , Rfl:    cycloSPORINE (RESTASIS) 0.05 % ophthalmic emulsion, , Disp: , Rfl:    pantoprazole (PROTONIX) 40 MG tablet, Take 1 tablet (40 mg total) by mouth daily before breakfast., Disp: 90 tablet, Rfl: 3  No Known Allergies   ROS: Review of Systems A comprehensive review of systems was negative except for: Genitourinary: positive for stress incontinence    Physical exam BP 126/74   Pulse 80   Ht 5\' 7"  (1.702 m)   SpO2 95%   BMI 28.82 kg/m  General appearance: alert, cooperative, appears stated age, and no distress Head: Normocephalic, without obvious abnormality, atraumatic Eyes: negative findings: lids and lashes normal, conjunctivae and sclerae normal, corneas clear, and pupils equal, round, reactive to light and accomodation Ears: normal TM's and external ear canals both ears Nose: Nares normal. Septum midline. Mucosa normal. No drainage or sinus tenderness. Throat: lips, mucosa, and tongue normal; teeth and gums normal Neck: no adenopathy, supple, symmetrical, trachea midline, and thyroid not enlarged, symmetric, no tenderness/mass/nodules Back:  Slight scoliotic curve noted to the right. Lungs: clear to auscultation bilaterally Heart:  Regular rate and rhythm with 1 out of 6 systolic murmur noted at bilateral sternal borders Abdomen: soft, non-tender; bowel sounds normal; no masses,  no organomegaly Extremities: extremities normal, atraumatic, no cyanosis.trace ankle edema Pulses: 2+ and symmetric Skin: Skin color, texture, turgor normal. No rashes or lesions Lymph nodes: Cervical, supraclavicular, and axillary nodes normal. Neurologic: Grossly normal      10/16/2023    3:39 PM 10/12/2023    2:52 PM 10/06/2022    2:53 PM  Depression screen PHQ 2/9  Decreased Interest 0 0 0  Down, Depressed, Hopeless 0 0 0  PHQ - 2 Score 0 0 0  Altered sleeping 0    Tired,  decreased energy 0    Change in appetite 0    Feeling bad or failure about yourself  0    Trouble concentrating 0    Moving slowly or fidgety/restless 0    Suicidal thoughts 0    PHQ-9 Score 0    Difficult doing work/chores Not difficult at all        10/16/2023    3:39 PM  GAD 7 : Generalized Anxiety Score  Nervous, Anxious, on Edge 0  Control/stop worrying 0  Worry too much - different things 0  Trouble relaxing 0  Restless 0  Easily annoyed or irritable 0  Afraid - awful might happen 0  Total GAD 7 Score 0  Anxiety Difficulty Not difficult at all     Assessment/ Plan: [name] here for annual physical exam.   Annual physical exam  Newly recognized heart murmur - Plan: CMP14+EGFR, TSH, CBC, ECHOCARDIOGRAM COMPLETE, CANCELED: ECHOCARDIOGRAM  COMPLETE  Age-related osteoporosis without current pathological fracture - Plan: CMP14+EGFR, TSH, VITAMIN D 25 Hydroxy (Vit-D Deficiency, Fractures)  Obesity (BMI 30-39.9) - Plan: CMP14+EGFR, TSH, VITAMIN D 25 Hydroxy (Vit-D Deficiency, Fractures), Bayer DCA Hb A1c Waived  Pure hypercholesterolemia - Plan: CMP14+EGFR, Lipid Panel, TSH  Encounter for immunization - Plan: Flu Vaccine Trivalent High Dose (Fluad)  Patient has been lost to follow-up for over 5 years now.  Will try and get her up-to-date on preventative health care.  I am going ahead and placing an echocardiogram to further explore this newly recognized heart murmur that I suspect is likely aortic stenosis.  Further interventions pending results.  Will also check for any metabolic abnormalities that would suggest anemia causing heart murmur.  No reported bleeding on exam today  Will check vitamin D, calcium and renal function given known osteoporosis based on previous imaging  A1c collected given BMI of greater than 30.  Nonfasting lipid panel collected  Influenza vaccination, shingles vaccination and tetanus shot updated today  Counseled on healthy lifestyle choices,  including diet (rich in fruits, vegetables and lean meats and low in salt and simple carbohydrates) and exercise (at least 30 minutes of moderate physical activity daily).  Patient to follow up 6-49m  Isamu Trammel M. Nadine Counts, DO

## 2023-10-17 LAB — CMP14+EGFR
ALT: 19 IU/L (ref 0–32)
AST: 25 IU/L (ref 0–40)
Albumin: 4.5 g/dL (ref 3.8–4.8)
Alkaline Phosphatase: 107 IU/L (ref 44–121)
BUN/Creatinine Ratio: 30 — ABNORMAL HIGH (ref 12–28)
BUN: 22 mg/dL (ref 8–27)
Bilirubin Total: 0.7 mg/dL (ref 0.0–1.2)
CO2: 25 mmol/L (ref 20–29)
Calcium: 9.8 mg/dL (ref 8.7–10.3)
Chloride: 103 mmol/L (ref 96–106)
Creatinine, Ser: 0.74 mg/dL (ref 0.57–1.00)
Globulin, Total: 2.4 g/dL (ref 1.5–4.5)
Glucose: 85 mg/dL (ref 70–99)
Potassium: 4.4 mmol/L (ref 3.5–5.2)
Sodium: 142 mmol/L (ref 134–144)
Total Protein: 6.9 g/dL (ref 6.0–8.5)
eGFR: 82 mL/min/{1.73_m2} (ref >59)

## 2023-10-17 LAB — LIPID PANEL
Chol/HDL Ratio: 3.3 ratio (ref 0.0–4.4)
Cholesterol, Total: 196 mg/dL (ref 100–199)
HDL: 60 mg/dL (ref >39)
LDL Chol Calc (NIH): 101 mg/dL — ABNORMAL HIGH (ref 0–99)
Triglycerides: 207 mg/dL — ABNORMAL HIGH (ref 0–149)
VLDL Cholesterol Cal: 35 mg/dL (ref 5–40)

## 2023-10-17 LAB — CBC
Hematocrit: 37.2 % (ref 34.0–46.6)
Hemoglobin: 12.3 g/dL (ref 11.1–15.9)
MCH: 29.5 pg (ref 26.6–33.0)
MCHC: 33.1 g/dL (ref 31.5–35.7)
MCV: 89 fL (ref 79–97)
Platelets: 263 10*3/uL (ref 150–450)
RBC: 4.17 x10E6/uL (ref 3.77–5.28)
RDW: 12.1 % (ref 11.7–15.4)
WBC: 6.3 10*3/uL (ref 3.4–10.8)

## 2023-10-17 LAB — TSH: TSH: 2.03 u[IU]/mL (ref 0.450–4.500)

## 2023-10-17 LAB — VITAMIN D 25 HYDROXY (VIT D DEFICIENCY, FRACTURES): Vit D, 25-Hydroxy: 26.1 ng/mL — ABNORMAL LOW (ref 30.0–100.0)

## 2023-10-19 ENCOUNTER — Encounter: Payer: Self-pay | Admitting: Family Medicine

## 2023-10-19 ENCOUNTER — Ambulatory Visit: Payer: PPO

## 2023-10-19 ENCOUNTER — Other Ambulatory Visit: Payer: Self-pay | Admitting: Family Medicine

## 2023-10-19 DIAGNOSIS — E7800 Pure hypercholesterolemia, unspecified: Secondary | ICD-10-CM

## 2023-10-19 MED ORDER — ROSUVASTATIN CALCIUM 10 MG PO TABS: 10.0000 mg | ORAL_TABLET | Freq: Every day | ORAL | 3 refills | Status: AC

## 2023-10-19 NOTE — Progress Notes (Signed)
 Meds ordered this encounter  Medications   rosuvastatin (CRESTOR) 10 MG tablet    Sig: Take 1 tablet (10 mg total) by mouth daily.    Dispense:  90 tablet    Refill:  3   Orders Placed This Encounter  Procedures   CT CARDIAC SCORING (SELF PAY ONLY)    Standing Status:   Future    Expiration Date:   10/18/2024    Preferred imaging location?:   [facility]   The 10-year ASCVD risk score (Arnett DK, et al., 2019) is: 23.8%   Values used to calculate the score:     Age: 78 years     Sex: Female     Is Non-Hispanic African American: No     Diabetic: No     Tobacco smoker: No     Systolic Blood Pressure: 126 mmHg     Is BP treated: No     HDL Cholesterol: 60 mg/dL     Total Cholesterol: 196 mg/dL

## 2023-10-21 ENCOUNTER — Encounter: Payer: Self-pay | Admitting: Family Medicine

## 2023-11-06 ENCOUNTER — Encounter: Payer: Self-pay | Admitting: Family Medicine

## 2023-11-06 ENCOUNTER — Ambulatory Visit (HOSPITAL_COMMUNITY)
Admission: RE | Admit: 2023-11-06 | Discharge: 2023-11-06 | Disposition: A | Discharge status: Home / Self Care | Payer: Self-pay | Source: Ambulatory Visit | Attending: Family Medicine | Admitting: Family Medicine

## 2023-11-06 ENCOUNTER — Ambulatory Visit (HOSPITAL_COMMUNITY)
Admission: RE | Admit: 2023-11-06 | Discharge: 2023-11-06 | Disposition: A | Discharge status: Home / Self Care | Source: Ambulatory Visit | Attending: Family Medicine | Admitting: Family Medicine

## 2023-11-06 DIAGNOSIS — R011 Cardiac murmur, unspecified: Secondary | ICD-10-CM | POA: Diagnosis not present

## 2023-11-06 DIAGNOSIS — E7800 Pure hypercholesterolemia, unspecified: Secondary | ICD-10-CM

## 2023-11-06 DIAGNOSIS — R931 Abnormal findings on diagnostic imaging of heart and coronary circulation: Secondary | ICD-10-CM

## 2023-11-06 LAB — ECHOCARDIOGRAM COMPLETE
AR max vel: 1.56 cm2
AV Area VTI: 1.59 cm2
AV Area mean vel: 1.87 cm2
AV Mean grad: 6.5 mmHg
AV Peak grad: 13.7 mmHg
Ao pk vel: 1.85 m/s
Area-P 1/2: 3.99 cm2
S' Lateral: 2.8 cm

## 2023-11-06 NOTE — Progress Notes (Signed)
*  PRELIMINARY RESULTS* Echocardiogram 2D Echocardiogram has been performed.  Stacey Drain 11/06/2023, 12:34 PM

## 2023-11-16 ENCOUNTER — Encounter: Payer: Self-pay | Admitting: Family Medicine

## 2023-11-17 MED ORDER — PANTOPRAZOLE SODIUM 40 MG PO TBEC: 40.0000 mg | DELAYED_RELEASE_TABLET | Freq: Every day | ORAL | 3 refills | Status: AC

## 2023-11-19 ENCOUNTER — Encounter: Payer: Self-pay | Admitting: Family Medicine

## 2023-11-27 ENCOUNTER — Ambulatory Visit
Admission: RE | Admit: 2023-11-27 | Discharge: 2023-11-27 | Disposition: A | Discharge status: Home / Self Care | Source: Ambulatory Visit | Attending: Family Medicine | Admitting: Family Medicine

## 2023-11-27 ENCOUNTER — Ambulatory Visit

## 2023-11-27 DIAGNOSIS — Z1231 Encounter for screening mammogram for malignant neoplasm of breast: Secondary | ICD-10-CM

## 2023-12-28 ENCOUNTER — Encounter: Payer: Self-pay | Admitting: Family Medicine

## 2024-01-18 ENCOUNTER — Ambulatory Visit: Admitting: Family Medicine

## 2024-01-20 ENCOUNTER — Ambulatory Visit (INDEPENDENT_AMBULATORY_CARE_PROVIDER_SITE_OTHER): Admitting: Family Medicine

## 2024-01-20 ENCOUNTER — Encounter: Payer: Self-pay | Admitting: Family Medicine

## 2024-01-20 VITALS — BP 102/61 | HR 85 | Temp 98.6°F | Ht 67.0 in | Wt 187.8 lb

## 2024-01-20 DIAGNOSIS — H9193 Unspecified hearing loss, bilateral: Secondary | ICD-10-CM

## 2024-01-20 DIAGNOSIS — E7800 Pure hypercholesterolemia, unspecified: Secondary | ICD-10-CM

## 2024-01-20 DIAGNOSIS — M810 Age-related osteoporosis without current pathological fracture: Secondary | ICD-10-CM | POA: Diagnosis not present

## 2024-01-20 DIAGNOSIS — Z23 Encounter for immunization: Secondary | ICD-10-CM

## 2024-01-20 DIAGNOSIS — R931 Abnormal findings on diagnostic imaging of heart and coronary circulation: Secondary | ICD-10-CM | POA: Diagnosis not present

## 2024-01-20 NOTE — Progress Notes (Signed)
 Subjective: CC: Hyperlipidemia PCP: Eliodoro Guerin, DO HPI:Kimberly Davis is a 78 y.o. female who is accompanied today by her daughter.  She is presenting to clinic today for:  1.  Hyperlipidemia with elevated CAC score Patient was started on Crestor  10 mg daily last visit.  She is tolerating medication without difficulty.  She is eliminated red meat and has cut back on her country ham which is her favorite food.  She denies any chest pain, shortness of breath, joint pain.  2.  Hearing loss She reports bilateral hearing loss, left greater than right.  This is been ongoing for years but seems to be more prominent over the last year.  She reports having to ask people to repeat themselves but she is able to hear on the telephone just fine.  Wants to know if she has any cerumen in her ears.  If not would like referral to audiology  3.  Vitamin D  insufficiency associated with osteoporosis She is compliant with calcium  and vitamin D  OTC.  She has been cleaning up diet as above.  No significant structured exercise as she cares for her husband who suffers from Parkinson's disease and dementia pretty much around-the-clock.   ROS: Per HPI  No Known Allergies Past Medical History:  Diagnosis Date   GERD (gastroesophageal reflux disease)     Current Outpatient Medications:    BOOSTRIX 5-2.5-18.5 LF-MCG/0.5 injection, , Disp: , Rfl:    Calcium  600-200 MG-UNIT tablet, Take 1 tablet by mouth 2 (two) times daily., Disp: , Rfl:    Cholecalciferol (VITAMIN D -3) 1000 units CAPS, Take by mouth. As needed, Disp: , Rfl:    cycloSPORINE (RESTASIS) 0.05 % ophthalmic emulsion, , Disp: , Rfl:    pantoprazole  (PROTONIX ) 40 MG tablet, Take 1 tablet (40 mg total) by mouth daily before breakfast., Disp: 90 tablet, Rfl: 3   rosuvastatin  (CRESTOR ) 10 MG tablet, Take 1 tablet (10 mg total) by mouth daily., Disp: 90 tablet, Rfl: 3   SHINGRIX  injection, , Disp: , Rfl:  Social History   Socioeconomic History    Marital status: Married    Spouse name: Royston Cornea   Number of children: 3   Years of education: Not on file   Highest education level: Some college, no degree  Occupational History   Occupation: Engineer, 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  She is the primary caregiver for him.  Her daughter is also very involved in helping them.   Social Drivers of Corporate investment banker Strain: Low Risk  (01/18/2024)   Overall Financial Resource Strain (CARDIA)    Difficulty of Paying Living Expenses: Not very hard  Food Insecurity: No Food Insecurity (01/18/2024)   Hunger Vital Sign    Worried About Running Out of Food in the Last Year: Never true    Ran Out of Food in the Last Year: Never true  Transportation Needs: No Transportation Needs (01/18/2024)   PRAPARE - Administrator, Civil Service (Medical): No    Lack of Transportation (Non-Medical): No  Physical Activity: Insufficiently Active (01/18/2024)  Exercise Vital Sign    Days of Exercise per Week: 3 days    Minutes of Exercise per Session: 30 min  Stress: No Stress Concern Present (01/18/2024)   [name]    Feeling of Stress : Not at all  Social Connections: Moderately Integrated (01/18/2024)   Social Connection and Isolation Panel [NHANES]    Frequency of Communication with Friends and Family: More than three times a week    Frequency of Social Gatherings with Friends and Family: Twice a week    Attends Religious Services: More than 4 times per year    Active Member of [employer] or Organizations: No    Attends Occupational hygienist Meetings: Never    Marital Status: Married  Catering manager Violence: Not At Risk (10/12/2023)   Humiliation, Afraid, Rape, and Kick questionnaire    Fear of Current or Ex-Partner: No    Emotionally Abused: No    Physically Abused: No    Sexually Abused: No   Family History  Problem Relation Age of Onset   Stroke Mother    Heart disease Father    Stroke Sister    Alzheimer's disease Sister    COPD Brother    Stroke Brother    Melanoma Daughter    Healthy Daughter    Healthy Son    Colon cancer Neg Hx     Objective: Office vital signs reviewed. BP 102/61   Pulse 85   Temp 98.6 F (37 C)   Ht 5\' 7"  (1.702 m)   Wt 187 lb 12.8 oz (85.2 kg)   SpO2 94%   BMI 29.41 kg/m   Physical Examination:  General: Awake, alert, well nourished, No acute distress HEENT: TMs intact bilaterally with normal light reflex.  No cerumen in external auditory canal appreciated Cardio: regular rate and rhythm, S1S2 heard, no murmurs appreciated Pulm: clear to auscultation bilaterally, no wheezes, rhonchi or rales; normal work of breathing on room air   Assessment/ Plan: 78 y.o. female   Pure hypercholesterolemia - Plan: LDL Cholesterol, Direct, Hepatic Function Panel  Agatston CAC score, >400 - Plan: LDL Cholesterol, Direct, Hepatic Function Panel  Age-related osteoporosis without current pathological fracture - Plan: VITAMIN D  25 Hydroxy (Vit-D Deficiency, Fractures)  Bilateral hearing loss, unspecified hearing loss type - Plan: Ambulatory referral to Audiology  Need for shingles vaccine  Continue statin.  Keep appointment with cardiology.  Direct LDL and LFTs were collected today.  Tolerating the statin without any difficulty  Recheck vitamin D  given insufficiency in the setting of osteoporosis  Shingles vaccination administered and referral to audiology placed for bilateral hearing loss.  Ear exam was unremarkable   Eliodoro Guerin, DO [facility] [phone]

## 2024-01-21 LAB — HEPATIC FUNCTION PANEL
ALT: 18 IU/L (ref 0–32)
AST: 22 IU/L (ref 0–40)
Albumin: 4.6 g/dL (ref 3.8–4.8)
Alkaline Phosphatase: 88 IU/L (ref 44–121)
Bilirubin Total: 1 mg/dL (ref 0.0–1.2)
Bilirubin, Direct: 0.32 mg/dL (ref 0.00–0.40)
Total Protein: 7 g/dL (ref 6.0–8.5)

## 2024-01-21 LAB — VITAMIN D 25 HYDROXY (VIT D DEFICIENCY, FRACTURES): Vit D, 25-Hydroxy: 30.5 ng/mL (ref 30.0–100.0)

## 2024-01-21 LAB — LDL CHOLESTEROL, DIRECT: LDL Direct: 43 mg/dL (ref 0–99)

## 2024-01-22 NOTE — Addendum Note (Signed)
 Addended by: Curvin Downing on: 01/22/2024 11:12 AM   Modules accepted: Orders

## 2024-01-25 ENCOUNTER — Ambulatory Visit: Payer: Self-pay | Admitting: Family Medicine

## 2024-02-09 DIAGNOSIS — B351 Tinea unguium: Secondary | ICD-10-CM | POA: Diagnosis not present

## 2024-02-09 DIAGNOSIS — M79674 Pain in right toe(s): Secondary | ICD-10-CM | POA: Diagnosis not present

## 2024-02-09 DIAGNOSIS — M79675 Pain in left toe(s): Secondary | ICD-10-CM | POA: Diagnosis not present

## 2024-02-10 ENCOUNTER — Ambulatory Visit: Attending: Family Medicine | Admitting: Audiologist

## 2024-02-10 DIAGNOSIS — H90A32 Mixed conductive and sensorineural hearing loss, unilateral, left ear with restricted hearing on the contralateral side: Secondary | ICD-10-CM | POA: Diagnosis not present

## 2024-02-10 DIAGNOSIS — H90A21 Sensorineural hearing loss, unilateral, right ear, with restricted hearing on the contralateral side: Secondary | ICD-10-CM | POA: Diagnosis not present

## 2024-02-10 NOTE — Procedures (Signed)
  Outpatient Audiology and [facility] [address] [place], [place]  [address] [phone]  AUDIOLOGICAL  EVALUATION  NAME: Kimberly Davis     DOB:   [birth year]      MRN: [id number]                                                                                     DATE: 02/10/2024     REFERENT: [name], DO STATUS: Outpatient DIAGNOSIS: Sensorineural Hearing Loss Right Ear, Mixed Hearing Loss Left Ear    History: Kimberly Davis was seen for an audiological evaluation due to difficulty hearing people clearly. This has been going on for years.  She noticed a change in her hearing in the left ear only several months ago. She has never had a hearing test.  Kimberly Davis denies pain or pressure in either ear. She gets occasional cricket sounds in both ears.   Kimberly Davis has no history of hazardous noise exposure.  Medical history shows no other medical risk for hearing loss.   Evaluation:  Otoscopy showed a clear view of the tympanic membranes, bilaterally Tympanometry results were consistent with normal middle ear function, bilaterally   Audiometric testing was completed using Conventional Audiometry techniques with insert earphones and supraural headphones. Test results are consistent with . Speech Recognition Thresholds were obtained at  45dB HL in the right ear and at 50dB HL in the left ear. Word Recognition Testing was completed at  40dB SL and Kimberly Davis scored 88% in each ear.    Results:  The test results were reviewed with Kimberly Davis and her daughter. Kimberly Davis has sloping normal to moderately severe sensorineural hearing loss in the right ear. Left ear has moderate to moderately severe mixed hearing loss. Conductive component in left ear 250-1kHz. Consistent across transducers.  Audiogram printed and provided to Kimberly Davis and her daughter.      Recommendations: Hearing aids recommended for both ears. Patient given list of local hearing aid providers.  Annual audiometric testing recommended to monitor  hearing loss for progression.  Recommend referral to Otolaryngology due to mixed hearing loss in the left ear. See above.     38 minutes spent testing and counseling on results.   If you have any questions please feel free to contact me at [phone].  Lauraine Ka Stalnaker Au.D.  Audiologist   02/10/2024  4:54 PM  Cc: [name], DO

## 2024-02-11 ENCOUNTER — Encounter: Payer: Self-pay | Admitting: Family Medicine

## 2024-02-12 ENCOUNTER — Other Ambulatory Visit: Payer: Self-pay | Admitting: Family Medicine

## 2024-02-12 DIAGNOSIS — H9192 Unspecified hearing loss, left ear: Secondary | ICD-10-CM

## 2024-02-12 NOTE — Telephone Encounter (Signed)
 Can you please make sure that referral goes: [facility] [place].

## 2024-02-23 NOTE — Progress Notes (Deleted)
 Cardiology Office Note:    Date:  02/23/2024   ID:  Kimberly Davis, DOB 07-Aug-1946, MRN [id number]  PCP:  [name], DO   [facility] Providers Cardiologist:  None { Click to update primary MD,subspecialty MD or APP then REFRESH:1}    Referring MD: [name], DO   No chief complaint on file. ***  History of Present Illness:    Kimberly Davis is a 78 y.o. female seen at the request of Dr Jolinda for evaluation of elevated coronary calcium  score. She has HLD. Echo earlier this year was unremarkable. Normal EF. Mild MR. AV sclerosis. Coronary calcium  score was elevated 916.   Past Medical History:  Diagnosis Date   GERD (gastroesophageal reflux disease)     Past Surgical History:  Procedure Laterality Date   ESOPHAGOGASTRODUODENOSCOPY N/A 12/28/2015   Dr. Harvey: large hiatal hernia, gastric ulcer, gastritis. bx positive for h.pylori    ESOPHAGOGASTRODUODENOSCOPY N/A 04/04/2016   Dr. Harvey: gastritis, hiatal hernia. GU healed. Bx negative for H.pylori   TUBAL LIGATION      Current Medications: No outpatient medications have been marked as taking for the 02/26/24 encounter (Appointment) with [place], Agapita Savarino M, MD.     Allergies:   Patient has no known allergies.   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  She is the primary caregiver for him.  Her daughter  is also very involved in helping them.   Social Drivers of Corporate investment banker Strain: Low Risk  (01/18/2024)   Overall Financial Resource Strain (CARDIA)    Difficulty of Paying Living Expenses: Not very hard  Food Insecurity: No Food Insecurity (01/18/2024)   Hunger Vital Sign    Worried About Running Out of Food in the Last Year: Never true    Ran Out of Food in the Last Year: Never true  Transportation Needs: No Transportation Needs (01/18/2024)   PRAPARE - Administrator, Civil Service (Medical): No    Lack of Transportation (Non-Medical): No  Physical Activity: Insufficiently Active (01/18/2024)   Exercise Vital Sign    Days of Exercise per Week: 3 days    Minutes of Exercise per Session: 30 min  Stress: No Stress Concern Present (01/18/2024)   [name]    Feeling of Stress : Not at all  Social Connections: Moderately Integrated (01/18/2024)   Social Connection and Isolation Panel    Frequency of Communication with Friends and Family: More than three times a week    Frequency of Social Gatherings with Friends and Family: Twice a week    Attends Religious Services: More than 4 times per year    Active Member of [employer] or Organizations: No    Attends Banker Meetings: Never  Marital Status: Married     Family History: The patient's ***family history includes Alzheimer's disease in her sister; COPD in her brother; Healthy in her daughter and son; Heart disease in her father; Melanoma in her daughter; Stroke in her brother, mother, and sister. There is no history of Colon cancer.  ROS:   Please see the history of present illness.    *** All other systems reviewed and are negative.  EKGs/Labs/Other Studies Reviewed:    The following studies were reviewed today: Echo 11/06/23: IMPRESSIONS     1. Left ventricular ejection fraction, by estimation, is 65 to 70%. The  left ventricle has  normal function. The left ventricle has no regional  wall motion abnormalities. Left ventricular diastolic parameters are  consistent with Grade II diastolic  dysfunction (pseudonormalization).   2. Right ventricular systolic function is normal. The right ventricular  size is normal. There is normal pulmonary artery systolic pressure. The  estimated right ventricular systolic pressure is 27.6 mmHg.   3. Left atrial size was moderately dilated.   4. The mitral valve is grossly normal. Trivial mitral valve  regurgitation.   5. The aortic valve is tricuspid. Aortic valve regurgitation is trivial.  Aortic valve sclerosis is present, with no evidence of aortic valve  stenosis. Aortic valve mean gradient measures 6.5 mmHg.   6. The inferior vena cava is normal in size with greater than 50%  respiratory variability, suggesting right atrial pressure of 3 mmHg.   Comparison(s): No prior Echocardiogram.   Coronary calcium  score 11/06/23:  OVER-READ INTERPRETATION  CT CHEST   The following report is an over-read performed by radiologist Dr. Franky Leff [facility] on 11/19/2023. This over-read does not include interpretation of cardiac or coronary anatomy or pathology. The coronary calcium  score interpretation by the cardiologist is attached.   COMPARISON:  None.   FINDINGS: Aortic atherosclerosis. No evidence of aneurysm. Heart is normal size. Moderate to large hiatal hernia. No adenopathy. No confluent airspace opacities or effusions. No acute findings in the upper abdomen. Chest wall soft tissues are unremarkable. No acute bony abnormality.   IMPRESSION: Aortic atherosclerosis.   Moderate to large hiatal hernia.   No acute findings.     Electronically Signed   By: Franky Crease M.D.   On: 11/19/2023 23:21    Addended by Crease Franky, MD on 11/19/2023 11:23 PM    Study Result  Narrative & Impression  CLINICAL DATA:  Cardiovascular Disease Risk stratification    EXAM: Coronary Calcium  Score   TECHNIQUE: A gated, non-contrast computed tomography scan of the heart was performed using 3mm slice thickness. Axial images were analyzed on a dedicated workstation. Calcium  scoring of the coronary arteries was performed using the Agatston method.   FINDINGS: Coronary arteries: Normal origins.   Coronary Calcium  Score:   Left main: 0   Left anterior descending artery: 374   Left circumflex artery: 243   Right coronary artery: 300   Total: 916   Percentile: 89th   Pericardium: Normal.   Ascending Aorta: Normal caliber.  Scattered calcifications.   Non-cardiac: See separate report from [facility].   IMPRESSION: Coronary calcium  score of 916. This was 89th percentile for age-, race-, and sex-matched controls.           Recent Labs: 10/16/2023: BUN 22; Creatinine, Ser 0.74; Hemoglobin 12.3; Platelets 263; Potassium 4.4; Sodium 142; TSH 2.030 01/20/2024: ALT 18  Recent Lipid Panel    Component Value Date/Time   CHOL 196 10/16/2023 1603  TRIG 207 (H) 10/16/2023 1603   HDL 60 10/16/2023 1603   CHOLHDL 3.3 10/16/2023 1603   LDLCALC 101 (H) 10/16/2023 1603   LDLDIRECT 43 01/20/2024 1521     Risk Assessment/Calculations:   {Does this patient have ATRIAL FIBRILLATION?[phone]}  No BP recorded.  {Refresh Note OR Click here to enter BP  :1}***         Physical Exam:    VS:  There were no vitals taken for this visit.    Wt Readings from Last 3 Encounters:  01/20/24 187 lb 12.8 oz (85.2 kg)  10/12/23 184 lb (83.5 kg)  06/21/21 184 lb (83.5 kg)     GEN: *** Well nourished, well developed in no acute distress HEENT: Normal NECK: No JVD; No carotid bruits LYMPHATICS: No lymphadenopathy CARDIAC: ***RRR, no murmurs, rubs, gallops RESPIRATORY:  Clear to auscultation without rales, wheezing or rhonchi  ABDOMEN: Soft, non-tender, non-distended MUSCULOSKELETAL:  No edema; No deformity  SKIN: Warm and dry NEUROLOGIC:   Alert and oriented x 3 PSYCHIATRIC:  Normal affect   ASSESSMENT:    No diagnosis found. PLAN:    In order of problems listed above:  ***      {Are you ordering a CV Procedure (e.g. stress test, cath, DCCV, TEE, etc)?   Press F2        :[id number]}    Medication Adjustments/Labs and Tests Ordered: Current medicines are reviewed at length with the patient today.  Concerns regarding medicines are outlined above.  No orders of the defined types were placed in this encounter.  No orders of the defined types were placed in this encounter.   There are no Patient Instructions on file for this visit.   Signed, Erynn Vaca [place], MD  02/23/2024 5:12 PM    [facility]

## 2024-02-26 ENCOUNTER — Ambulatory Visit: Admitting: Cardiology

## 2024-03-16 DIAGNOSIS — H903 Sensorineural hearing loss, bilateral: Secondary | ICD-10-CM | POA: Diagnosis not present

## 2024-05-11 ENCOUNTER — Other Ambulatory Visit: Payer: PPO

## 2024-05-14 NOTE — Progress Notes (Signed)
 Cardiology Office Note:    Date:  05/23/2024   ID:  Kimberly Davis, DOB 1945/10/21, MRN [id number]  PCP:  [name], DO   [facility] Providers Cardiologist:  None     Referring MD: [name], DO   Chief Complaint  Patient presents with   Agatston CAC score, >400   New Patient (Initial Visit)    History of Present Illness:    ROBERTTA Davis is a 78 y.o. female seen at the request of Kimberly Jolinda for evaluation of CAD. She has a history of HLD. Echo done earlier this year was unremarkable. She had a coronary calcium  score of 916.   She has no prior history of cardiac disease. Strong family history of stroke. She is primary caregiver for her husband who has Parkinson's disease. She tries and walk 30 minutes a day. No chest pain. Minimal SOB at times. She has been on Crestor .    Past Medical History:  Diagnosis Date   GERD (gastroesophageal reflux disease)    Hypercholesteremia     Past Surgical History:  Procedure Laterality Date   ESOPHAGOGASTRODUODENOSCOPY N/A 12/28/2015   Kimberly. Harvey: large hiatal hernia, gastric ulcer, gastritis. bx positive for h.pylori    ESOPHAGOGASTRODUODENOSCOPY N/A 04/04/2016   Kimberly. Harvey: gastritis, hiatal hernia. GU healed. Bx negative for H.pylori   TUBAL LIGATION      Current Medications: Current Meds  Medication Sig   aspirin EC 81 MG tablet Take 1 tablet (81 mg total) by mouth daily. Swallow whole.   BOOSTRIX 5-2.5-18.5 LF-MCG/0.5 injection    Calcium  600-200 MG-UNIT tablet Take 1 tablet by mouth 2 (two) times daily.   Cholecalciferol (VITAMIN D -3) 1000 units CAPS Take by mouth. As needed   cycloSPORINE (RESTASIS) 0.05 % ophthalmic emulsion    pantoprazole  (PROTONIX ) 40 MG tablet Take 1 tablet (40 mg total) by mouth daily before breakfast.   rosuvastatin  (CRESTOR ) 10 MG tablet Take 1 tablet (10 mg total) by mouth daily.   SHINGRIX  injection      Allergies:   Patient has no known allergies.   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  She is the primary caregiver for him.  Her daughter is also very involved in helping them.   Social Drivers of Corporate investment banker Strain: Low Risk  (01/18/2024)   Overall Financial Resource Strain (CARDIA)    Difficulty of Paying Living Expenses: Not very hard  Food Insecurity: No Food Insecurity (01/18/2024)   Hunger Vital Sign    Worried About Running Out of Food in the Last Year: Never true    Ran Out of Food in the Last Year: Never true  Transportation Needs: No Transportation Needs (01/18/2024)   PRAPARE - Transportation    Lack of Transportation (Medical): No    Lack  of Transportation (Non-Medical): No  Physical Activity: Insufficiently Active (01/18/2024)   Exercise Vital Sign    Days of Exercise per Week: 3 days    Minutes of Exercise per Session: 30 min  Stress: No Stress Concern Present (01/18/2024)   [name]    Feeling of Stress : Not at all  Social Connections: Moderately Integrated (01/18/2024)   Social Connection and Isolation Panel    Frequency of Communication with Friends and Family: More than three times a week    Frequency of Social Gatherings with Friends and Family: Twice a week    Attends Religious Services: More than 4 times per year    Active Member of [employer] or Organizations:  No    Attends Banker Meetings: Never    Marital Status: Married     Family History: The patient's family history includes Alzheimer's disease in her sister; Aortic aneurysm in her father; COPD in her brother; Healthy in her daughter and son; Heart disease (age of onset: 63) in her father; Melanoma in her daughter; Stroke in her brother and sister; Stroke (age of onset: 81) in her mother. There is no history of Colon cancer.  ROS:   Please see the history of present illness.     All other systems reviewed and are negative.  EKGs/Labs/Other Studies Reviewed:    The following studies were reviewed today:  EKG Interpretation Date/Time:  Monday May 23 2024 14:37:33 EDT Ventricular Rate:  78 PR Interval:  158 QRS Duration:  86 QT Interval:  382 QTC Calculation: 435 R Axis:   22  Text Interpretation: Normal sinus rhythm Normal ECG When compared with ECG of 28-Dec-2015 01:04, HR is slower Confirmed by Davis, Kimberly Wack [phone]) on 05/23/2024 2:41:36 PM   Echo 11/06/23: IMPRESSIONS     1. Left ventricular ejection fraction, by estimation, is 65 to 70%. The  left ventricle has normal function. The left ventricle has no regional  wall motion abnormalities. Left ventricular diastolic parameters are  consistent with Grade II diastolic  dysfunction (pseudonormalization).   2. Right ventricular systolic function is normal. The right ventricular  size is normal. There is normal pulmonary artery systolic pressure. The  estimated right ventricular systolic pressure is 27.6 mmHg.   3. Left atrial size was moderately dilated.   4. The mitral valve is grossly normal. Trivial mitral valve  regurgitation.   5. The aortic valve is tricuspid. Aortic valve regurgitation is trivial.  Aortic valve sclerosis is present, with no evidence of aortic valve  stenosis. Aortic valve mean gradient measures 6.5 mmHg.   6. The inferior vena cava is normal in size with greater than 50%  respiratory  variability, suggesting right atrial pressure of 3 mmHg.   Comparison(s): No prior Echocardiogram.   Coronary calcium  score 11/06/23: Coronary Calcium  Score   TECHNIQUE: A gated, non-contrast computed tomography scan of the heart was performed using 3mm slice thickness. Axial images were analyzed on a dedicated workstation. Calcium  scoring of the coronary arteries was performed using the Agatston method.   FINDINGS: Coronary arteries: Normal origins.   Coronary Calcium  Score:   Left main: 0   Left anterior descending artery: 374   Left circumflex artery: 243   Right coronary artery: 300   Total: 916   Percentile: 89th   Pericardium: Normal.   Ascending Aorta: Normal caliber.  Scattered calcifications.   Non-cardiac: See separate report from [facility].   IMPRESSION: Coronary calcium  score of 916.  This was 89th percentile for age-, race-, and sex-matched controls.   EKG Interpretation Date/Time:  Monday May 23 2024 14:37:33 EDT Ventricular Rate:  78 PR Interval:  158 QRS Duration:  86 QT Interval:  382 QTC Calculation: 435 R Axis:   22  Text Interpretation: Normal sinus rhythm Normal ECG When compared with ECG of 28-Dec-2015 01:04, HR is slower Confirmed by Davis, Asia Dusenbury [phone]) on 05/23/2024 2:41:36 PM    Recent Labs: 10/16/2023: BUN 22; Creatinine, Ser 0.74; Hemoglobin 12.3; Platelets 263; Potassium 4.4; Sodium 142; TSH 2.030 01/20/2024: ALT 18  Recent Lipid Panel    Component Value Date/Time   CHOL 196 10/16/2023 1603   TRIG 207 (H) 10/16/2023 1603   HDL 60 10/16/2023 1603   CHOLHDL 3.3 10/16/2023 1603   LDLCALC 101 (H) 10/16/2023 1603   LDLDIRECT 43 01/20/2024 1521     Risk Assessment/Calculations:                Physical Exam:    VS:  BP 124/78 (BP Location: Left Arm, Patient Position: Sitting, Cuff Size: Normal)   Pulse 79   Resp 16   Ht 5' 7 (1.702 m)   Wt 183 lb 6.4 oz (83.2 kg)   SpO2 95%   BMI 28.72 kg/m     Wt  Readings from Last 3 Encounters:  05/23/24 183 lb 6.4 oz (83.2 kg)  01/20/24 187 lb 12.8 oz (85.2 kg)  10/12/23 184 lb (83.5 kg)     GEN:  Well nourished, well developed in no acute distress HEENT: Normal NECK: No JVD; No carotid bruits LYMPHATICS: No lymphadenopathy CARDIAC: RRR, slight 1/6 systolic murmur rubs, gallops RESPIRATORY:  Clear to auscultation without rales, wheezing or rhonchi  ABDOMEN: Soft, non-tender, non-distended MUSCULOSKELETAL:  No edema; No deformity  SKIN: Warm and dry NEUROLOGIC:  Alert and oriented x 3 PSYCHIATRIC:  Normal affect   ASSESSMENT:    1. Agatston CAC score, >400    PLAN:    In order of problems listed above:  Coronary artery calcification. No clinical symptoms of angina. Recommend ongoing risk factor modification. Recommend she take ASA 81 mg daily. Heart healthy diet. Regular walking. If she does develop any chest pain or unusual SOB let us  know. HLD . On Crestor . LDL 43 is at goal.    Follow up in one year.            Medication Adjustments/Labs and Tests Ordered: Current medicines are reviewed at length with the patient today.  Concerns regarding medicines are outlined above.  Orders Placed This Encounter  Procedures   EKG 12-Lead   Meds ordered this encounter  Medications   aspirin EC 81 MG tablet    Sig: Take 1 tablet (81 mg total) by mouth daily. Swallow whole.    There are no Patient Instructions on file for this visit.   Signed, Sammi Stolarz [place], MD  05/23/2024 2:52 PM    [facility]

## 2024-05-18 DIAGNOSIS — H903 Sensorineural hearing loss, bilateral: Secondary | ICD-10-CM | POA: Diagnosis not present

## 2024-05-23 ENCOUNTER — Encounter: Payer: Self-pay | Admitting: Cardiology

## 2024-05-23 ENCOUNTER — Ambulatory Visit: Attending: Cardiology | Admitting: Cardiology

## 2024-05-23 VITALS — BP 124/78 | HR 79 | Resp 16 | Ht 67.0 in | Wt 183.4 lb

## 2024-05-23 DIAGNOSIS — R931 Abnormal findings on diagnostic imaging of heart and coronary circulation: Secondary | ICD-10-CM | POA: Diagnosis not present

## 2024-05-23 DIAGNOSIS — E7800 Pure hypercholesterolemia, unspecified: Secondary | ICD-10-CM | POA: Diagnosis not present

## 2024-05-23 MED ORDER — ASPIRIN 81 MG PO TBEC: 81.0000 mg | DELAYED_RELEASE_TABLET | Freq: Every day | ORAL | Status: AC

## 2024-05-23 NOTE — Patient Instructions (Signed)
 Medication Instructions:  Take Aspirin 81 mg daily Continue all other medications *If you need a refill on your cardiac medications before your next appointment, please call your pharmacy*  Lab Work: None ordered  Testing/Procedures: None ordered  Follow-Up: At [facility], you and your health needs are our priority.  As part of our continuing mission to provide you with exceptional heart care, our providers are all part of one team.  This team includes your primary Cardiologist (physician) and Advanced Practice Providers or APPs (Physician Assistants and Nurse Practitioners) who all work together to provide you with the care you need, when you need it.  Your next appointment:  1 year   Call in May to schedule Oct appointment     Provider:  Dr.Jordan   We recommend signing up for the patient portal called MyChart.  Sign up information is provided on this After Visit Summary.  MyChart is used to connect with patients for Virtual Visits (Telemedicine).  Patients are able to view lab/test results, encounter notes, upcoming appointments, etc.  Non-urgent messages can be sent to your provider as well.   To learn more about what you can do with MyChart, go to [email or web address].

## 2024-07-25 ENCOUNTER — Telehealth: Payer: Self-pay

## 2024-07-25 ENCOUNTER — Ambulatory Visit: Payer: Self-pay | Admitting: Family Medicine

## 2024-07-25 VITALS — BP 123/76 | HR 79 | Temp 98.1°F | Ht 63.0 in | Wt 180.0 lb

## 2024-07-25 DIAGNOSIS — R931 Abnormal findings on diagnostic imaging of heart and coronary circulation: Secondary | ICD-10-CM | POA: Diagnosis not present

## 2024-07-25 DIAGNOSIS — Z23 Encounter for immunization: Secondary | ICD-10-CM | POA: Diagnosis not present

## 2024-07-25 DIAGNOSIS — K449 Diaphragmatic hernia without obstruction or gangrene: Secondary | ICD-10-CM

## 2024-07-25 DIAGNOSIS — E7800 Pure hypercholesterolemia, unspecified: Secondary | ICD-10-CM | POA: Diagnosis not present

## 2024-07-25 NOTE — Telephone Encounter (Signed)
 Reached out to see about rescheduling appointment or to see if patient could come in early due to weather, patient stated that she would talk to her son and call back.

## 2024-07-25 NOTE — Progress Notes (Signed)
 Subjective: CC: Interval checkup PCP: Kimberly Kimberly HERO, DO HPI:Kimberly Davis is a 78 y.o. female presenting to clinic today for:  Patient is compliant with her medications.  Reports no chest pain, shortness of breath, change in exercise tolerance.  GERD has been well-controlled and stable with Protonix .  She notes her hiatal hernia has been fairly quiet and denies any changes in bowel habits, blood in stool or change in appetite or satiety.  Needs influenza vaccination today.  She is brought to the office by her son today and accompanied by her spouse   ROS: Per HPI  No Known Allergies Past Medical History:  Diagnosis Date   GERD (gastroesophageal reflux disease)    Hypercholesteremia     Current Outpatient Medications:    aspirin  EC 81 MG tablet, Take 1 tablet (81 mg total) by mouth daily. Swallow whole., Disp: , Rfl:    Calcium  600-200 MG-UNIT tablet, Take 1 tablet by mouth 2 (two) times daily., Disp: , Rfl:    Cholecalciferol (VITAMIN D -3) 1000 units CAPS, Take by mouth. 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  She is the primary caregiver for him.  Her daughter is also very involved in helping them.   Social Drivers of Corporate Investment Banker Strain: Low Risk  (07/25/2024)   Overall Financial Resource Strain (CARDIA)    Difficulty of Paying Living Expenses: Not very hard  Food Insecurity: No Food Insecurity (07/25/2024)   Hunger Vital Sign    Worried About Running Out of Food in the Last Year: Never true    Ran Out of Food in the Last Year: Never true  Transportation Needs: No Transportation Needs (07/25/2024)   PRAPARE - Administrator, Civil Service (Medical): No    Lack of Transportation (Non-Medical): No  Physical Activity: Insufficiently Active (07/25/2024)   Exercise Vital Sign    Days of Exercise per Week: 4 days    Minutes of Exercise per Session: 30 min  Stress: No Stress Concern Present (07/25/2024)   [name]    Feeling of Stress: Only a little  Social Connections: Moderately Integrated (07/25/2024)   Social Connection and Isolation Panel    Frequency of Communication with Friends and Family: More than three times a week    Frequency of Social Gatherings with Friends and Family: More than three times a week    Attends Religious Services: Patient  declined    Active Member of Clubs or Organizations: Yes    Attends Banker Meetings: Patient declined    Marital Status: Married  Catering Manager Violence: Not At Risk (10/12/2023)   Humiliation, Afraid, Rape, and Kick questionnaire    Fear of Current or Ex-Partner: No    Emotionally Abused: No    Physically Abused: No    Sexually Abused: No   Family History  Problem Relation Age of Onset   Stroke Mother 28   Heart disease Father 29   Aortic aneurysm Father    Stroke Sister    Alzheimer's disease Sister    COPD Brother    Stroke Brother    Melanoma Daughter    Healthy Daughter     Healthy Son    Colon cancer Neg Hx     Objective: Office vital signs reviewed. BP 123/76   Pulse 79   Temp 98.1 F (36.7 C)   Ht 5' 3 (1.6 m)   Wt 180 lb (81.6 kg)   SpO2 96%   BMI 31.89 kg/m   Physical Examination:  General: Awake, alert, well nourished, No acute distress HEENT: sclera white, MMM Cardio: regular rate and rhythm, S1S2 heard, soft systolic murmurs appreciated Pulm: clear to auscultation bilaterally, no wheezes, rhonchi or rales; normal work of breathing on room air GI: soft, non-tender, non-distended, bowel sounds present x4, no hepatomegaly, no splenomegaly, no masses GU: No suprapubic tenderness palpation  Assessment/ Plan: 78 y.o. female   Pure hypercholesterolemia  Agatston CAC score, >400  Hiatal hernia  Encounter for immunization - Plan: Flu vaccine HIGH DOSE PF(Fluzone Trivalent)   LDL at goal in June.  Plan for full physical with fasting labs in 6 months.  In the meantime, influenza vaccination administered.  Continue all medications as prescribed  GERD from hiatal hernia is stable.  Continue PPI.   Kimberly CHRISTELLA Fielding, DO [facility] [phone]

## 2024-10-12 ENCOUNTER — Ambulatory Visit: Payer: Self-pay

## 2024-11-09 ENCOUNTER — Ambulatory Visit

## 2025-01-25 ENCOUNTER — Ambulatory Visit: Admitting: Family Medicine
